# Patient Record
Sex: Female | Born: 1940
Health system: Southern US, Community
[De-identification: ages and names within clinical notes are randomized; demographics above are authoritative.]

## PROBLEM LIST (undated history)

## (undated) DIAGNOSIS — F329 Major depressive disorder, single episode, unspecified: Secondary | ICD-10-CM

## (undated) DIAGNOSIS — M1990 Unspecified osteoarthritis, unspecified site: Secondary | ICD-10-CM

## (undated) DIAGNOSIS — R269 Unspecified abnormalities of gait and mobility: Secondary | ICD-10-CM

## (undated) DIAGNOSIS — T7840XA Allergy, unspecified, initial encounter: Secondary | ICD-10-CM

## (undated) DIAGNOSIS — F0390 Unspecified dementia without behavioral disturbance: Secondary | ICD-10-CM

## (undated) DIAGNOSIS — I10 Essential (primary) hypertension: Secondary | ICD-10-CM

## (undated) DIAGNOSIS — F32A Depression, unspecified: Secondary | ICD-10-CM

## (undated) DIAGNOSIS — N189 Chronic kidney disease, unspecified: Secondary | ICD-10-CM

## (undated) DIAGNOSIS — E785 Hyperlipidemia, unspecified: Secondary | ICD-10-CM

## (undated) HISTORY — PX: TONSILLECTOMY: SUR1361

## (undated) HISTORY — DX: Unspecified abnormalities of gait and mobility: R26.9

## (undated) HISTORY — DX: Unspecified dementia, unspecified severity, without behavioral disturbance, psychotic disturbance, mood disturbance, and anxiety: F03.90

## (undated) HISTORY — DX: Chronic kidney disease, unspecified: N18.9

## (undated) HISTORY — DX: Essential (primary) hypertension: I10

## (undated) HISTORY — DX: Major depressive disorder, single episode, unspecified: F32.9

## (undated) HISTORY — DX: Unspecified osteoarthritis, unspecified site: M19.90

## (undated) HISTORY — DX: Hyperlipidemia, unspecified: E78.5

## (undated) HISTORY — DX: Depression, unspecified: F32.A

## (undated) HISTORY — DX: Allergy, unspecified, initial encounter: T78.40XA

---

## 1986-01-07 HISTORY — PX: COLOSTOMY: SHX63

## 1986-01-07 HISTORY — PX: OTHER SURGICAL HISTORY: SHX169

## 1997-01-07 HISTORY — PX: COLOSTOMY REVERSAL: SHX5782

## 2012-07-07 DIAGNOSIS — Q613 Polycystic kidney, unspecified: Secondary | ICD-10-CM | POA: Insufficient documentation

## 2012-07-07 DIAGNOSIS — Z905 Acquired absence of kidney: Secondary | ICD-10-CM | POA: Insufficient documentation

## 2013-06-27 DIAGNOSIS — N3020 Other chronic cystitis without hematuria: Secondary | ICD-10-CM | POA: Insufficient documentation

## 2016-07-11 LAB — QUANTIFERON TB GOLD ASSAY (BLOOD): QUANTIFERON TB GOLD: NEGATIVE

## 2017-03-20 ENCOUNTER — Encounter: Payer: Self-pay | Admitting: Nurse Practitioner

## 2017-03-20 ENCOUNTER — Ambulatory Visit: Payer: Medicare Other | Admitting: Nurse Practitioner

## 2017-03-20 VITALS — BP 136/70 | HR 102 | Temp 98.3°F | Ht 62.0 in | Wt 153.0 lb

## 2017-03-20 DIAGNOSIS — H9193 Unspecified hearing loss, bilateral: Secondary | ICD-10-CM | POA: Insufficient documentation

## 2017-03-20 DIAGNOSIS — H6123 Impacted cerumen, bilateral: Secondary | ICD-10-CM | POA: Diagnosis not present

## 2017-03-20 DIAGNOSIS — Z0001 Encounter for general adult medical examination with abnormal findings: Secondary | ICD-10-CM

## 2017-03-20 DIAGNOSIS — I10 Essential (primary) hypertension: Secondary | ICD-10-CM

## 2017-03-20 DIAGNOSIS — Q613 Polycystic kidney, unspecified: Secondary | ICD-10-CM | POA: Insufficient documentation

## 2017-03-20 DIAGNOSIS — R413 Other amnesia: Secondary | ICD-10-CM | POA: Insufficient documentation

## 2017-03-20 DIAGNOSIS — Z780 Asymptomatic menopausal state: Secondary | ICD-10-CM | POA: Diagnosis not present

## 2017-03-20 DIAGNOSIS — E782 Mixed hyperlipidemia: Secondary | ICD-10-CM

## 2017-03-20 DIAGNOSIS — B372 Candidiasis of skin and nail: Secondary | ICD-10-CM

## 2017-03-20 DIAGNOSIS — Z905 Acquired absence of kidney: Secondary | ICD-10-CM | POA: Insufficient documentation

## 2017-03-20 DIAGNOSIS — R739 Hyperglycemia, unspecified: Secondary | ICD-10-CM | POA: Diagnosis not present

## 2017-03-20 DIAGNOSIS — F325 Major depressive disorder, single episode, in full remission: Secondary | ICD-10-CM | POA: Insufficient documentation

## 2017-03-20 DIAGNOSIS — Z9049 Acquired absence of other specified parts of digestive tract: Secondary | ICD-10-CM | POA: Insufficient documentation

## 2017-03-20 LAB — BASIC METABOLIC PANEL
BUN: 15 mg/dL (ref 6–23)
CHLORIDE: 101 meq/L (ref 96–112)
CO2: 28 meq/L (ref 19–32)
CREATININE: 0.89 mg/dL (ref 0.40–1.20)
Calcium: 9.5 mg/dL (ref 8.4–10.5)
GFR: 65.26 mL/min (ref >60.00)
GLUCOSE: 108 mg/dL — AB (ref 70–99)
Potassium: 4.4 mEq/L (ref 3.5–5.1)
Sodium: 138 mEq/L (ref 135–145)

## 2017-03-20 LAB — HEMOGLOBIN A1C: HEMOGLOBIN A1C: 5.1 % (ref 4.6–6.5)

## 2017-03-20 LAB — HEPATIC FUNCTION PANEL
ALBUMIN: 4.2 g/dL (ref 3.5–5.2)
ALT: 13 U/L (ref 0–35)
AST: 13 U/L (ref 0–37)
Alkaline Phosphatase: 69 U/L (ref 39–117)
Bilirubin, Direct: 0.1 mg/dL (ref 0.0–0.3)
TOTAL PROTEIN: 7 g/dL (ref 6.0–8.3)
Total Bilirubin: 0.5 mg/dL (ref 0.2–1.2)

## 2017-03-20 LAB — LIPID PANEL
CHOLESTEROL: 145 mg/dL (ref 0–200)
HDL: 75.5 mg/dL (ref >39.00)
LDL Cholesterol: 52 mg/dL (ref 0–99)
NonHDL: 69.91
TRIGLYCERIDES: 90 mg/dL (ref 0.0–149.0)
Total CHOL/HDL Ratio: 2
VLDL: 18 mg/dL (ref 0.0–40.0)

## 2017-03-20 LAB — TSH: TSH: 2.29 u[IU]/mL (ref 0.35–4.50)

## 2017-03-20 MED ORDER — CLOTRIMAZOLE-BETAMETHASONE 1-0.05 % EX CREA: 1.0000 "application " | TOPICAL_CREAM | Freq: Two times a day (BID) | CUTANEOUS | 1 refills | Status: AC

## 2017-03-20 MED ORDER — CARBAMIDE PEROXIDE 6.5 % OT SOLN: 5.0000 [drp] | Freq: Two times a day (BID) | OTIC | 0 refills | Status: DC

## 2017-03-20 MED ORDER — MICONAZOLE NITRATE 2 % EX POWD: Freq: Two times a day (BID) | CUTANEOUS | 1 refills | Status: AC | PRN

## 2017-03-20 NOTE — Progress Notes (Signed)
Subjective:  Patient ID: Tricia Daniel, female    DOB: [birth year]  Age: 77 y.o. MRN: [id number]  CC: Establish Care (est care/check up blood work,fasting)  HPI Accompanied by daughter today Tricia Daniel, [place]).  She lives with daughter, son in law and granddaughter.  Has 24hrs supervision at home.  Previous pcp: Dr. Katherine Daniel. Last labs doen 12/2016.  [place] [facility] every 62months: Dr. Franchot Daniel every Sunday.  Methotrexate every Thurday  Dr. Sharlee Daniel: Podiatry every 60months due to long and thick nails. No numbness or tingling No foot ulcers No edema.  Polycystic kidney disease: Nephrologist: Dr. Mena Daniel. Has OV once a year S/p left nephrectomy 1988 due to severe infection NSAIDs used seldomly for severe pain. Reports nephrology is aware per patient.  Depression: Stable with prozac x 64yrs. Short term memory deficit per daughter: Visual and auditory hallucination x 1year. Psychosis (suspicious behavior, accusing family of taking things) x 24months. No wandering No labile mood. Sleeps 9-11hrs a day.  Hard of Hearing (bilateral): need referral to audiology.  High Fall risk: 76falls in last 73month. Grab bars in bath room and shower. Raised commode. No loose rugs. Lift chair at home. Hospital bed at home. Use of walker.(daughter reports she is more dependent on her walker recently due to fear of falling)  Has advanced directive with assigned HCPOA Tricia Daniel).  Rash under right axilla  Stool incontinence due to loose bowels( chronic per daughter, onset after partial colectomy 1988 and reversed colostomy 1989)  Not interested in mammogram at this time. Never had one.  Outpatient Medications Prior to Visit  Medication Sig Dispense Refill  . aspirin EC 81 MG tablet Take 81 mg by mouth daily.    . diclofenac sodium (VOLTAREN) 1 % GEL Apply topically.    Marland Kitchen etanercept (ENBREL SURECLICK) 50 MG/ML injection Inject into the skin.    Marland Kitchen FLUoxetine  (PROZAC) 10 MG capsule TAKE ONE CAPSULE BY MOUTH EVERY night, patient needs office visit  1  . FLUoxetine (PROZAC) 40 MG capsule Take by mouth daily.  1  . folic acid (FOLVITE) 1 MG tablet Take 1 mg by mouth daily.  6  . furosemide (LASIX) 40 MG tablet Take 40 mg by mouth.    Marland Kitchen ibuprofen (ADVIL,MOTRIN) 200 MG tablet Take by mouth.    . meloxicam (MOBIC) 7.5 MG tablet Take 7.5 mg by mouth 2 (two) times daily as needed.  3  . methotrexate (RHEUMATREX) 2.5 MG tablet Take by mouth.    . potassium chloride SA (K-DUR,KLOR-CON) 20 MEQ tablet Take by mouth.    . predniSONE (DELTASONE) 5 MG tablet TAKE ONE-HALF TO ONE tablet daily FOR 30 DAYS  3  . amLODipine (NORVASC) 10 MG tablet Take by mouth.    . simvastatin (ZOCOR) 20 MG tablet TAKE ONE TABLET BY MOUTH nighly  1   No facility-administered medications prior to visit.     ROS See HPI  Objective:  BP 136/70   Pulse (!) 102   Temp 98.3 F (36.8 C)   Ht 5\' 2"  (1.575 m)   Wt 153 lb (69.4 kg)   SpO2 96%   BMI 27.98 kg/m   BP Readings from Last 3 Encounters:  03/20/17 136/70    Wt Readings from Last 3 Encounters:  03/20/17 153 lb (69.4 kg)    Physical Exam  Constitutional: She is oriented to person, place, and time. No distress.  Neck: Normal range of motion. Neck supple.  Cardiovascular: Normal rate, regular rhythm and  normal heart sounds.  No murmur heard. Pulmonary/Chest: Effort normal and breath sounds normal.  Abdominal: Soft. Bowel sounds are normal. She exhibits no distension. There is no tenderness.  Musculoskeletal: She exhibits no edema.  Neurological: She is alert and oriented to person, place, and time.  Ambulates with walker.  Skin: Skin is warm and dry.  Psychiatric: She has a normal mood and affect. Her behavior is normal.  Vitals reviewed.   Lab Results  Component Value Date   GLUCOSE 108 (H) 03/20/2017   CHOL 145 03/20/2017   TRIG 90.0 03/20/2017   HDL 75.50 03/20/2017   LDLCALC 52 03/20/2017   ALT 13  03/20/2017   AST 13 03/20/2017   NA 138 03/20/2017   K 4.4 03/20/2017   CL 101 03/20/2017   CREATININE 0.89 03/20/2017   BUN 15 03/20/2017   CO2 28 03/20/2017   TSH 2.29 03/20/2017   HGBA1C 5.1 03/20/2017   Assessment & Plan:   [name] was seen today for establish care.  Diagnoses and all orders for this visit:  Encounter for preventative adult health care exam with abnormal findings -     Lipid panel -     TSH  Asymptomatic postmenopausal estrogen deficiency -     Vitamin D 1,25 dihydroxy -     DG Bone Density  Hyperglycemia -     Hemoglobin A1c  Mixed hyperlipidemia -     Lipid panel -     simvastatin (ZOCOR) 20 MG tablet; TAKE ONE TABLET BY MOUTH nighly  Essential hypertension -     Basic metabolic panel -     Hepatic function panel -     amLODipine (NORVASC) 10 MG tablet; Take 1 tablet (10 mg total) by mouth daily.  Polycystic kidney disease -     Basic metabolic panel  Candidal intertrigo -     miconazole (MICOTIN) 2 % powder; Apply topically 2 (two) times daily as needed for itching. -     clotrimazole-betamethasone (LOTRISONE) cream; Apply 1 application topically 2 (two) times daily.  Bilateral hearing loss, unspecified hearing loss type -     Ambulatory referral to Audiology  Bilateral impacted cerumen -     carbamide peroxide (DEBROX) 6.5 % OTIC solution; Place 5 drops into both ears 2 (two) times daily.  Depression, major, single episode, complete remission (HCC)  Memory deficit -     Ambulatory referral to Neurology   I have changed Tricia Daniel's amLODipine. I am also having her start on miconazole, clotrimazole-betamethasone, and carbamide peroxide. Additionally, I am having her maintain her potassium chloride SA, predniSONE, folic acid, meloxicam, FLUoxetine, FLUoxetine, methotrexate, diclofenac sodium, ibuprofen, etanercept, aspirin EC, furosemide, and simvastatin.  Meds ordered this encounter  Medications  . miconazole (MICOTIN) 2 % powder      Sig: Apply topically 2 (two) times daily as needed for itching.    Dispense:  70 g    Refill:  1    Order Specific Question:   Supervising Provider    Answer:   Dianne Dun [[id number]]  . clotrimazole-betamethasone (LOTRISONE) cream    Sig: Apply 1 application topically 2 (two) times daily.    Dispense:  45 g    Refill:  1    Order Specific Question:   Supervising Provider    Answer:   Dianne Dun [[id number]]  . carbamide peroxide (DEBROX) 6.5 % OTIC solution    Sig: Place 5 drops into both ears 2 (two) times daily.    Dispense:  15 mL    Refill:  0    Order Specific Question:   Supervising Provider    Answer:   Dianne Dun [[id number]]  . amLODipine (NORVASC) 10 MG tablet    Sig: Take 1 tablet (10 mg total) by mouth daily.    Dispense:  90 tablet    Refill:  3    Order Specific Question:   Supervising Provider    Answer:   Dianne Dun [[id number]]  . simvastatin (ZOCOR) 20 MG tablet    Sig: TAKE ONE TABLET BY MOUTH nighly    Dispense:  90 tablet    Refill:  3    Order Specific Question:   Supervising Provider    Answer:   Dianne Dun [[id number]]   Follow-up: Return in about 4 days (around 03/24/2017) for cerumen removal and re eval memory deficit (needs MMSE completed).  Alysia Penna, NP

## 2017-03-20 NOTE — Patient Instructions (Addendum)
You will be contacted to schedule appt with neurology, audiology and for dexa scan.  Go to lab for blood draw. Will send medication refill after review of lab results.  Please bring copy of Advance directive with assigned HCPOA.  Use debrox solution 3days prior to next appt, to facilitate ear lavage.

## 2017-03-21 ENCOUNTER — Encounter: Payer: Self-pay | Admitting: Nurse Practitioner

## 2017-03-21 DIAGNOSIS — E782 Mixed hyperlipidemia: Secondary | ICD-10-CM | POA: Insufficient documentation

## 2017-03-21 MED ORDER — SIMVASTATIN 20 MG PO TABS: ORAL_TABLET | ORAL | 3 refills | Status: DC

## 2017-03-21 MED ORDER — AMLODIPINE BESYLATE 10 MG PO TABS: 10.0000 mg | ORAL_TABLET | Freq: Every day | ORAL | 3 refills | Status: DC

## 2017-03-23 LAB — VITAMIN D 1,25 DIHYDROXY
Vitamin D 1, 25 (OH)2 Total: 21 pg/mL (ref 18–72)
Vitamin D2 1, 25 (OH)2: <8 pg/mL
Vitamin D3 1, 25 (OH)2: 21 pg/mL

## 2017-03-24 ENCOUNTER — Encounter: Payer: Self-pay | Admitting: Nurse Practitioner

## 2017-03-24 ENCOUNTER — Ambulatory Visit: Payer: Medicare Other | Admitting: Nurse Practitioner

## 2017-03-24 VITALS — BP 132/80 | HR 93 | Temp 98.0°F | Ht 62.0 in | Wt 154.0 lb

## 2017-03-24 DIAGNOSIS — H6123 Impacted cerumen, bilateral: Secondary | ICD-10-CM

## 2017-03-24 NOTE — Patient Instructions (Signed)
Advised to continue use of debrox solution.

## 2017-03-24 NOTE — Progress Notes (Signed)
Subjective:  Patient ID: Tricia Daniel, female    DOB: January 31, 1940  Age: 77 y.o. MRN: [id number]  CC: Follow-up (F/U with cerumen. No pain. Memory F/U)   HPI  Tricia Daniel is here for cerumen removal. She is accompanied by her daughter. She has used debrox solution for last 3days. Denies any ear pain or drainage or tinnitus.  Outpatient Medications Prior to Visit  Medication Sig Dispense Refill  . amLODipine (NORVASC) 10 MG tablet Take 1 tablet (10 mg total) by mouth daily. 90 tablet 3  . aspirin EC 81 MG tablet Take 81 mg by mouth daily.    . calcium acetate (PHOSLO) 667 MG capsule Take by mouth 3 (three) times daily with meals.    . Calcium Carbonate (CALCIUM 600 PO) Take 600 Units by mouth 2 (two) times daily.    . carbamide peroxide (DEBROX) 6.5 % OTIC solution Place 5 drops into both ears 2 (two) times daily. 15 mL 0  . Cholecalciferol (VITAMIN D PO) Take 800 Units by mouth daily.    . clotrimazole-betamethasone (LOTRISONE) cream Apply 1 application topically 2 (two) times daily. 45 g 1  . diclofenac sodium (VOLTAREN) 1 % GEL Apply topically.    Marland Kitchen etanercept (ENBREL SURECLICK) 50 MG/ML injection Inject into the skin.    Marland Kitchen FLUoxetine (PROZAC) 10 MG capsule TAKE ONE CAPSULE BY MOUTH EVERY night, patient needs office visit  1  . FLUoxetine (PROZAC) 40 MG capsule Take by mouth daily.  1  . folic acid (FOLVITE) 1 MG tablet Take 1 mg by mouth daily.  6  . furosemide (LASIX) 40 MG tablet Take 40 mg by mouth.    Marland Kitchen ibuprofen (ADVIL,MOTRIN) 200 MG tablet Take by mouth.    . meloxicam (MOBIC) 7.5 MG tablet Take 7.5 mg by mouth 2 (two) times daily as needed.  3  . methotrexate (RHEUMATREX) 2.5 MG tablet Take by mouth.    . miconazole (MICOTIN) 2 % powder Apply topically 2 (two) times daily as needed for itching. 70 g 1  . potassium chloride SA (K-DUR,KLOR-CON) 20 MEQ tablet Take by mouth.    . predniSONE (DELTASONE) 5 MG tablet TAKE ONE-HALF TO ONE tablet daily FOR 30 DAYS  3  . simvastatin  (ZOCOR) 20 MG tablet TAKE ONE TABLET BY MOUTH nighly 90 tablet 3   No facility-administered medications prior to visit.     ROS See HPI  Objective:  BP 132/80   Pulse 93   Temp 98 F (36.7 C) (Oral)   Ht 5\' 2"  (1.575 m)   Wt 154 lb (69.9 kg)   SpO2 95%   BMI 28.17 kg/m   BP Readings from Last 3 Encounters:  03/24/17 132/80  03/20/17 136/70    Wt Readings from Last 3 Encounters:  03/24/17 154 lb (69.9 kg)  03/20/17 153 lb (69.4 kg)    Physical Exam  HENT:  Right Ear: External ear and ear canal normal.  Left Ear: Tympanic membrane, external ear and ear canal normal.  Unable to completely clean right ear canal.  Cardiovascular: Normal rate.  Pulmonary/Chest: Effort normal.  Vitals reviewed.   Lab Results  Component Value Date   GLUCOSE 108 (H) 03/20/2017   CHOL 145 03/20/2017   TRIG 90.0 03/20/2017   HDL 75.50 03/20/2017   LDLCALC 52 03/20/2017   ALT 13 03/20/2017   AST 13 03/20/2017   NA 138 03/20/2017   K 4.4 03/20/2017   CL 101 03/20/2017   CREATININE 0.89 03/20/2017  BUN 15 03/20/2017   CO2 28 03/20/2017   TSH 2.29 03/20/2017   HGBA1C 5.1 03/20/2017    Procedure Note :     Procedure :  Ear irrigation (bilateral)   Indication:  Cerumen impaction (bilateral)   Risks, including pain, dizziness, eardrum perforation, bleeding, infection and others as well as benefits were explained to the patient in detail. Verbal consent was obtained and the patient agreed to proceed.    We used "The Elephant Ear Irrigation Device" filled with lukewarm water for irrigation. A large amount wax was recovered. Procedure has also required manual wax removal with an ear loop.   Tolerated well. Complications:none   Postprocedure instructions :  Call if problems.   Assessment & Plan:   There are no diagnoses linked to this encounter. I am having Tricia Daniel maintain her potassium chloride SA, predniSONE, folic acid, meloxicam, FLUoxetine, FLUoxetine, methotrexate,  diclofenac sodium, ibuprofen, etanercept, aspirin EC, furosemide, miconazole, clotrimazole-betamethasone, carbamide peroxide, amLODipine, simvastatin, Cholecalciferol (VITAMIN D PO), calcium acetate, and Calcium Carbonate (CALCIUM 600 PO).  No orders of the defined types were placed in this encounter.   Follow-up: No Follow-up on file.  Alysia Penna, NP

## 2017-04-04 ENCOUNTER — Other Ambulatory Visit: Payer: Self-pay

## 2017-04-04 ENCOUNTER — Encounter: Payer: Self-pay | Admitting: Neurology

## 2017-04-04 ENCOUNTER — Ambulatory Visit: Payer: Medicare Other | Admitting: Neurology

## 2017-04-04 VITALS — BP 144/76 | HR 92 | Ht 62.0 in | Wt 152.0 lb

## 2017-04-04 DIAGNOSIS — R269 Unspecified abnormalities of gait and mobility: Secondary | ICD-10-CM | POA: Diagnosis not present

## 2017-04-04 DIAGNOSIS — R413 Other amnesia: Secondary | ICD-10-CM

## 2017-04-04 DIAGNOSIS — E538 Deficiency of other specified B group vitamins: Secondary | ICD-10-CM | POA: Diagnosis not present

## 2017-04-04 HISTORY — DX: Unspecified abnormalities of gait and mobility: R26.9

## 2017-04-04 NOTE — Progress Notes (Signed)
Reason for visit: Memory disturbance, gait disturbance  Referring physician: Dr. Duayne Cal is a 77 y.o. female  History of present illness:  Tricia Daniel is a 77 year old right-handed white female with a history of a progressive memory disturbance that was mild over the last 5 years but has significantly worsened over the last 1 year.  The patient has had some issues with visual and auditory hallucinations, there was some concern about depression initially but this has improved.  The patient has difficulty with word finding, she remembers names of people fairly well.  The patient does not operate a motor vehicle, she never has.  She now requires assistance over the last year with keeping of medications and appointments.  Her daughter had to take over the finances about 4 years ago.  The patient has recently begun having some problems with walking that has become most significant over the last 1 month.  The patient now requires a walker to get around.  She needs assistance with standing up.  She has developed increased fatigue over the last 6 months.  She denies any numbness or weakness of the extremities, she denies neck pain or low back pain or pain down the arms or legs.  She does have some incontinence of the bowels and the bladder, she wears adult diapers.  She has a history of psoriatic arthritis and is on Enbrel for this.  The patient does not have a family history of memory problems.  She is sent to this office for further evaluation.  Past Medical History:  Diagnosis Date  . Allergy   . Arthritis   . Chronic kidney disease   . Dementia   . Depression   . Gait abnormality 04/04/2017  . Hyperlipidemia   . Hypertension     Past Surgical History:  Procedure Laterality Date  . COLOSTOMY  1988  . COLOSTOMY REVERSAL  1999  . nephrectomy Left 1988  . TONSILLECTOMY      Family History  Problem Relation Age of Onset  . Arthritis Mother   . Hearing loss Mother   . Heart  attack Father   . Heart disease Father   . Heart Problems Father   . Arthritis Brother   . Arthritis Daughter   . Asthma Daughter   . Depression Daughter   . Heart disease Daughter   . Hypertension Daughter   . Diabetes Maternal Grandmother   . Heart attack Maternal Grandmother   . Arthritis Daughter   . Depression Daughter   . Hyperlipidemia Daughter     Social history:  reports that she has never smoked. She has never used smokeless tobacco. She reports that she does not drink alcohol or use drugs.  Medications:  Prior to Admission medications   Medication Sig Start Date End Date Taking? Authorizing Provider  amLODipine (NORVASC) 10 MG tablet Take 1 tablet (10 mg total) by mouth daily. 03/21/17  Yes Nche, Bonna Gains, NP  aspirin EC 81 MG tablet Take 81 mg by mouth daily.   Yes [provider]  Calcium Acetate, Phos Binder, (CALCIUM ACETATE PO) Take 600 mg by mouth 2 (two) times daily.   Yes [provider]  carbamide peroxide (DEBROX) 6.5 % OTIC solution Place 5 drops into both ears 2 (two) times daily. 03/20/17  Yes Nche, Bonna Gains, NP  Cholecalciferol (VITAMIN D PO) Take 800 Units by mouth daily.   Yes [provider]  clotrimazole-betamethasone (LOTRISONE) cream Apply 1 application topically 2 (two)  times daily. 03/20/17  Yes Nche, Bonna Gains, NP  diclofenac sodium (VOLTAREN) 1 % GEL Apply topically. 01/06/15  Yes [provider]  etanercept (ENBREL SURECLICK) 50 MG/ML injection Inject into the skin. 08/29/16  Yes [provider]  FLUoxetine (PROZAC) 10 MG capsule TAKE ONE CAPSULE BY MOUTH EVERY night, patient needs office visit 12/25/16  Yes [provider]  FLUoxetine (PROZAC) 40 MG capsule Take by mouth daily. 03/07/17  Yes [provider]  folic acid (FOLVITE) 1 MG tablet Take 1 mg by mouth daily. 03/08/17  Yes [provider]  furosemide (LASIX) 40 MG tablet Take 40 mg by mouth.   Yes [provider]  ibuprofen (ADVIL,MOTRIN) 200 MG tablet Take by mouth.   Yes [provider]  meloxicam (MOBIC) 7.5 MG tablet Take 7.5 mg by mouth 2 (two) times daily as needed. 02/06/17  Yes [provider]  methotrexate (RHEUMATREX) 2.5 MG tablet Take 12.5 mg by mouth once a week. 5 tablets every Thursday   Yes [provider]  miconazole (MICOTIN) 2 % powder Apply topically 2 (two) times daily as needed for itching. 03/20/17  Yes Nche, Bonna Gains, NP  potassium chloride SA (K-DUR,KLOR-CON) 20 MEQ tablet Take by mouth. 09/06/16  Yes [provider]  predniSONE (DELTASONE) 5 MG tablet TAKE ONE-HALF TO ONE tablet daily FOR 30 DAYS 03/07/17  Yes [provider]  simvastatin (ZOCOR) 20 MG tablet TAKE ONE TABLET BY MOUTH nighly 03/21/17  Yes Nche, Bonna Gains, NP      Allergies  Allergen Reactions  . Latex Rash    If leave it on too long    ROS:  Out of a complete 14 system review of symptoms, the patient complains only of the following symptoms, and all other reviewed systems are negative.  Hearing loss Itching Incontinence of the bowels and bladder Easy bruising Feeling cold, increased thirst Joint pain, joint swelling, muscle cramps, aching muscles Memory loss, confusion Depression, anxiety, hallucinations  Blood pressure (!) 144/76, pulse 92, height 5\' 2"  (1.575 m), weight 152 lb (68.9 kg).  Physical Exam  General: The patient is alert and cooperative at the time of the examination.  Eyes: Pupils are equal, round, and reactive to light. Discs are flat bilaterally.  Neck: The neck is supple, no carotid bruits are noted.  Respiratory: The respiratory examination is clear.  Cardiovascular: The cardiovascular examination reveals a regular rate and rhythm, no obvious murmurs or rubs are noted.  Skin: Extremities are without significant edema.  Neurologic Exam  Mental status: The patient is alert and oriented x 3 at the time of the  examination. The Mini-Mental status examination done today shows a total score 25/30.  Cranial nerves: Facial symmetry is present. There is good sensation of the face to pinprick and soft touch bilaterally. The strength of the facial muscles and the muscles to head turning and shoulder shrug are normal bilaterally. Speech is well enunciated, no aphasia or dysarthria is noted. Extraocular movements are full. Visual fields are full. The tongue is midline, and the patient has symmetric elevation of the soft palate. No obvious hearing deficits are noted.  Motor: The motor testing reveals 5 over 5 strength of all 4 extremities, with exception of 4/5 strength with hip flexion bilaterally. Good symmetric motor tone is noted throughout.  Sensory: Sensory testing is intact to pinprick, soft touch and vibration sensation on all 4 extremities.  Position sense is decreased on all 4 extremities.  No evidence of extinction  is noted.  Coordination: Cerebellar testing reveals good finger-nose-finger and heel-to-shin bilaterally.  Mild apraxia with use of the extremities is seen.  Gait and station: Gait is extremely wide-based, unsteady, the patient requires assistance for standing.  Tandem gait was not attempted.  Romberg is unsteady but the patient does not fall.  Reflexes: Deep tendon reflexes are symmetric but are relatively brisk in the upper extremities, the ankle jerk reflexes are maintained bilaterally. Toes are downgoing bilaterally.   Assessment/Plan:  1.  Progressive memory disturbance  2.  Progressive gait disturbance  The patient has developed some troubles with memory that have accelerated over the last 1 year, within the last 1 month she has had onset of some significant issues with balance.  She has fallen on several occasions over the last month.  The examination shows some increased reflexes in the upper extremities.  The patient will be sent for blood work today, she will have MRI of the  brain.  Small vessel disease and normal pressure hydrocephalus do need to be considered, but the memory disturbance preceded the gait disturbance.  I suppose that could be 2 separate processes with cervical spine disease and a separate dementia process.  The patient will follow-up in 4 months.  Marlan Palau MD 04/04/2017 11:58 AM  [facility] [address] [place], [place] [address]  Phone [phone] Fax [phone]

## 2017-04-04 NOTE — Patient Instructions (Signed)
We will check MRI of the brain and get blood work today. 

## 2017-04-06 LAB — RPR: RPR: NONREACTIVE

## 2017-04-06 LAB — COPPER, SERUM: COPPER: 126 ug/dL (ref 72–166)

## 2017-04-06 LAB — SEDIMENTATION RATE: Sed Rate: 5 mm/hr (ref 0–40)

## 2017-04-06 LAB — VITAMIN B12: Vitamin B-12: 502 pg/mL (ref 232–1245)

## 2017-04-07 ENCOUNTER — Telehealth: Payer: Self-pay | Admitting: *Deleted

## 2017-04-07 ENCOUNTER — Telehealth: Payer: Self-pay | Admitting: Neurology

## 2017-04-07 NOTE — Telephone Encounter (Signed)
-----   Message from York Spaniel, MD sent at 04/06/2017  4:26 PM EDT -----  The blood work results are unremarkable. Please call the patient.  ----- Message ----- From: Nell Range Lab Results In Sent: 04/05/2017   7:40 AM To: York Spaniel, MD

## 2017-04-07 NOTE — Telephone Encounter (Signed)
Called and spoke w/ pt about unremarkable labs per CW,MD note. She verbalized understanding and appreciation for call.

## 2017-04-07 NOTE — Telephone Encounter (Signed)
[employer] Berkley Harvey: [id number] (exp. 04/07/17 to 05/06/17) order sent to GI they will contact pt to schedule.

## 2017-04-18 ENCOUNTER — Ambulatory Visit
Admission: RE | Admit: 2017-04-18 | Discharge: 2017-04-18 | Disposition: A | Discharge status: Home / Self Care | Payer: Medicare Other | Source: Ambulatory Visit | Attending: Neurology | Admitting: Neurology

## 2017-04-18 DIAGNOSIS — R413 Other amnesia: Secondary | ICD-10-CM | POA: Diagnosis not present

## 2017-04-18 DIAGNOSIS — R269 Unspecified abnormalities of gait and mobility: Secondary | ICD-10-CM

## 2017-04-20 ENCOUNTER — Telehealth: Payer: Self-pay | Admitting: Neurology

## 2017-04-20 NOTE — Telephone Encounter (Signed)
  I called the patient.  The MRI of the brain shows mild to moderate small vessel ischemic changes.  This probably would not explain her progressive balance changes, if she is amenable to it, we will go on and get a MRI of the cervical spine to rule out a cervical myelopathy.  The memory issues will need to be followed over time.  She will call us if she is okay with getting MRI of the cervical spine.  MRI brain 04/20/17:  IMPRESSION:  Unremarkable MRI Brain wo except for mild age related changes of chronic microvascular ischemia Study  is compromised by motion artefacts.

## 2017-05-02 ENCOUNTER — Encounter: Payer: Self-pay | Admitting: Nurse Practitioner

## 2017-05-07 ENCOUNTER — Telehealth: Payer: Self-pay | Admitting: Nurse Practitioner

## 2017-05-07 NOTE — Telephone Encounter (Signed)
Please advise, okey for refills? We never send these meds in before.      Copied from CRM [id number]. Topic: General - Other >> May 07, 2017  4:19 PM Elliot Gault wrote: Relation to pt: self Call back number: [phone] Pharmacy:   [facility]- [facility] - [place], [place] - [address] [phone] (Phone) [phone] (Fax)   Reason for call:  Daughter requesting potassium chloride SA (K-DUR,KLOR-CON) 20 MEQ tablet and FLUoxetine (PROZAC) 40 MG capsule, pharmacy contacted and request fax over twice last week, please advise  >> May 07, 2017  4:23 PM Elliot Gault wrote: Relation to pt: Rolley Sims / daughter  Call back number: [phone] Pharmacy:   [facility]- [facility] - [place], [place] - [address] [phone] (Phone) [phone] (Fax)   Reason for call:  Daughter requesting 90 day supply of potassium chloride SA (K-DUR,KLOR-CON) 20 MEQ tablet and FLUoxetine (PROZAC) 40 MG capsule, pharmacy contacted and request fax over twice last week, please advise  >> May 07, 2017  4:53 PM Alexander Bergeron B wrote: Pt's daughter called b/c she missed a phone call from the office that was not documented but contact pt's daughter if needed, contact info in previous note

## 2017-05-08 MED ORDER — FLUOXETINE HCL 10 MG PO CAPS: ORAL_CAPSULE | ORAL | 1 refills | Status: DC

## 2017-05-08 MED ORDER — POTASSIUM CHLORIDE CRYS ER 20 MEQ PO TBCR: 20.0000 meq | EXTENDED_RELEASE_TABLET | Freq: Every day | ORAL | 1 refills | Status: DC

## 2017-05-08 NOTE — Telephone Encounter (Signed)
Ok to refill, but please make sure she is still taking furosemide.  If not, do not send potassium

## 2017-05-08 NOTE — Telephone Encounter (Signed)
rx sent to pharmacy as pt's daughter requested.   Pt is taking Lasix.

## 2017-05-22 ENCOUNTER — Telehealth: Payer: Self-pay | Admitting: Nurse Practitioner

## 2017-05-22 DIAGNOSIS — I10 Essential (primary) hypertension: Secondary | ICD-10-CM

## 2017-05-22 NOTE — Telephone Encounter (Signed)
Copied from CRM [id number]. Topic: Quick Communication - Rx Refill/Question >> May 22, 2017  1:52 PM Alexander Bergeron B wrote: Pharmacy called to get verbals or a fax for the medication; since Nche is the new pcp the pharmacy will need this   Medication: furosemide (LASIX) 40 MG tablet [[id number]]  Has the patient contacted their pharmacy? Yes.   (Agent: If no, request that the patient contact the pharmacy for the refill.) Preferred Pharmacy (with phone number or street name): [facility] Agent: Please be advised that RX refills may take up to 3 business days. We ask that you follow-up with your pharmacy.

## 2017-05-22 NOTE — Telephone Encounter (Signed)
Please advise, we never send in this rx.

## 2017-05-22 NOTE — Telephone Encounter (Signed)
LOV  03/20/17 Tricia Daniel See pharmacy request.

## 2017-05-23 ENCOUNTER — Telehealth: Payer: Self-pay | Admitting: Nurse Practitioner

## 2017-05-23 MED ORDER — FUROSEMIDE 40 MG PO TABS: 40.0000 mg | ORAL_TABLET | Freq: Every day | ORAL | 0 refills | Status: DC

## 2017-05-23 NOTE — Telephone Encounter (Signed)
Left vm for the pt to call back, need to inform Nche message below.

## 2017-05-23 NOTE — Telephone Encounter (Signed)
Pt is aware. appt made for 08/2017

## 2017-05-23 NOTE — Telephone Encounter (Signed)
Based on last OV, my understanding was, this was prescribed by neprhology, so that is where her refill request needs to go. Thank you

## 2017-05-23 NOTE — Telephone Encounter (Signed)
Pt called in stating:   She called in questioning her Lasix Rx.   It used to be prescribed by Dr. Jacques Navy.  But the pt has transferred to the [facility] location.   The lasix was always ordered with her BP medication.   Her kidney doctor does not prescribe this.   She was given a 90 day refill from Dr. Katherine Mantle to get her though until she could get a refill.  Is it possible that Alysia Penna, NP can start prescribing this? Thanks.  I read the note to the pt from [facility] regarding the nephrologist needing to order it.   See above notes.   Please advise.

## 2017-05-23 NOTE — Telephone Encounter (Signed)
She called in questioning her Lasix Rx.   It used to be prescribed by Dr. Jacques Navy.  But the pt has transferred to the [facility] location.   The lasix was always ordered with her BP medication.   Her kidney doctor does not prescribe this.   She was given a 90 day refill from Dr. Katherine Mantle to get her though until she could get a refill.  Is it possible that Alysia Penna, NP can start prescribing this? Thanks.  I read the note to the pt from [facility] regarding the nephrologist needing to order it.   See above notes.

## 2017-08-15 ENCOUNTER — Other Ambulatory Visit: Payer: Self-pay | Admitting: Nurse Practitioner

## 2017-08-15 DIAGNOSIS — I10 Essential (primary) hypertension: Secondary | ICD-10-CM

## 2017-08-20 ENCOUNTER — Ambulatory Visit: Payer: Medicare Other | Admitting: Nurse Practitioner

## 2017-09-01 ENCOUNTER — Ambulatory Visit: Payer: Medicare Other | Admitting: Nurse Practitioner

## 2017-09-09 ENCOUNTER — Ambulatory Visit: Payer: Medicare Other | Admitting: Nurse Practitioner

## 2017-09-09 ENCOUNTER — Ambulatory Visit (INDEPENDENT_AMBULATORY_CARE_PROVIDER_SITE_OTHER): Payer: Medicare Other

## 2017-09-09 ENCOUNTER — Encounter: Payer: Self-pay | Admitting: Nurse Practitioner

## 2017-09-09 VITALS — BP 122/64 | HR 87 | Temp 98.2°F | Ht 62.0 in | Wt 134.0 lb

## 2017-09-09 DIAGNOSIS — R109 Unspecified abdominal pain: Secondary | ICD-10-CM

## 2017-09-09 DIAGNOSIS — R634 Abnormal weight loss: Secondary | ICD-10-CM | POA: Diagnosis not present

## 2017-09-09 DIAGNOSIS — I10 Essential (primary) hypertension: Secondary | ICD-10-CM | POA: Diagnosis not present

## 2017-09-09 DIAGNOSIS — F325 Major depressive disorder, single episode, in full remission: Secondary | ICD-10-CM

## 2017-09-09 DIAGNOSIS — R319 Hematuria, unspecified: Secondary | ICD-10-CM | POA: Diagnosis not present

## 2017-09-09 DIAGNOSIS — E782 Mixed hyperlipidemia: Secondary | ICD-10-CM

## 2017-09-09 LAB — POCT URINALYSIS DIPSTICK
BILIRUBIN UA: NEGATIVE
GLUCOSE UA: NEGATIVE
KETONES UA: NEGATIVE
Leukocytes, UA: NEGATIVE
Nitrite, UA: NEGATIVE
PH UA: 6 (ref 5.0–8.0)
Protein, UA: NEGATIVE
Spec Grav, UA: 1.015 (ref 1.010–1.025)
UROBILINOGEN UA: 0.2 U/dL

## 2017-09-09 LAB — BASIC METABOLIC PANEL
BUN: 23 mg/dL (ref 6–23)
CHLORIDE: 101 meq/L (ref 96–112)
CO2: 26 meq/L (ref 19–32)
Calcium: 9.9 mg/dL (ref 8.4–10.5)
Creatinine, Ser: 1.05 mg/dL (ref 0.40–1.20)
GFR: 53.86 mL/min — ABNORMAL LOW (ref >60.00)
Glucose, Bld: 107 mg/dL — ABNORMAL HIGH (ref 70–99)
Potassium: 4.8 mEq/L (ref 3.5–5.1)
SODIUM: 139 meq/L (ref 135–145)

## 2017-09-09 LAB — HEPATIC FUNCTION PANEL
ALBUMIN: 4.1 g/dL (ref 3.5–5.2)
ALT: 12 U/L (ref 0–35)
AST: 15 U/L (ref 0–37)
Alkaline Phosphatase: 53 U/L (ref 39–117)
BILIRUBIN DIRECT: 0.2 mg/dL (ref 0.0–0.3)
BILIRUBIN TOTAL: 0.8 mg/dL (ref 0.2–1.2)
TOTAL PROTEIN: 7.1 g/dL (ref 6.0–8.3)

## 2017-09-09 LAB — TSH: TSH: 1.87 u[IU]/mL (ref 0.35–4.50)

## 2017-09-09 MED ORDER — SIMVASTATIN 20 MG PO TABS: ORAL_TABLET | ORAL | 3 refills | Status: DC

## 2017-09-09 MED ORDER — FLUOXETINE HCL 10 MG PO CAPS: ORAL_CAPSULE | ORAL | 3 refills | Status: DC

## 2017-09-09 MED ORDER — AMLODIPINE BESYLATE 10 MG PO TABS: 10.0000 mg | ORAL_TABLET | Freq: Every day | ORAL | 3 refills | Status: DC

## 2017-09-09 NOTE — Progress Notes (Signed)
Subjective:  Patient ID: Tricia Daniel, female    DOB: 23-Apr-1940  Age: 77 y.o. MRN: [id number]  CC: Follow-up (med follow up/checking BMP/pneumonia shot?) and Back Pain (back pain,2-3 days. help when lay down,icy hot and gel. last time this complaint was dx UTI. )  Accompanied by daughter.  Back Pain  This is a new problem. The current episode started in the past 7 days. The problem occurs constantly. The problem has been waxing and waning since onset. The pain is present in the thoracic spine (thoracolumbar, right side). The quality of the pain is described as aching. The pain does not radiate. The symptoms are aggravated by lying down. Associated symptoms include bladder incontinence, bowel incontinence and weight loss. Pertinent negatives include no abdominal pain, chest pain, dysuria, fever, headaches, pelvic pain, perianal numbness or weakness. (Bowel and bladder incontinence (chronic)) Risk factors include poor posture, sedentary lifestyle, lack of exercise and history of osteoporosis. She has tried ice for the symptoms.   HTN: Stable with amlodipine. BP Readings from Last 3 Encounters:  09/09/17 122/64  04/04/17 (!) 144/76  03/24/17 132/80   Daughter reports 20Lbs weight loss since last OV, it is unintentional, reports decreased appetite and early satiety, no change in BM (incontinent, and loose since partial colectomy). Wt Readings from Last 3 Encounters:  09/09/17 134 lb (60.8 kg)  04/04/17 152 lb (68.9 kg)  03/24/17 154 lb (69.9 kg)   Mood: Stable with prozac 50mg .  Reviewed past Medical, Social and Family history today.  Outpatient Medications Prior to Visit  Medication Sig Dispense Refill  . Calcium Acetate, Phos Binder, (CALCIUM ACETATE PO) Take 600 mg by mouth 2 (two) times daily.    . carbamide peroxide (DEBROX) 6.5 % OTIC solution Place 5 drops into both ears 2 (two) times daily. 15 mL 0  . Cholecalciferol (VITAMIN D PO) Take 800 Units by mouth daily.    .  clotrimazole-betamethasone (LOTRISONE) cream Apply 1 application topically 2 (two) times daily. 45 g 1  . diclofenac sodium (VOLTAREN) 1 % GEL Apply topically.    Marland Kitchen etanercept (ENBREL SURECLICK) 50 MG/ML injection Inject into the skin.    . folic acid (FOLVITE) 1 MG tablet Take 1 mg by mouth daily.  6  . methotrexate (RHEUMATREX) 2.5 MG tablet Take 12.5 mg by mouth once a week. 5 tablets every Thursday    . miconazole (MICOTIN) 2 % powder Apply topically 2 (two) times daily as needed for itching. 70 g 1  . predniSONE (DELTASONE) 5 MG tablet TAKE ONE-HALF TO ONE tablet daily FOR 30 DAYS  3  . amLODipine (NORVASC) 10 MG tablet Take 1 tablet (10 mg total) by mouth daily. 90 tablet 3  . FLUoxetine (PROZAC) 10 MG capsule TAKE ONE CAPSULE BY MOUTH EVERY night 90 capsule 1  . furosemide (LASIX) 40 MG tablet Take 1 tablet (40 mg total) by mouth daily. Needs office visit for additional refills 90 tablet 1  . ibuprofen (ADVIL,MOTRIN) 200 MG tablet Take by mouth.    . meloxicam (MOBIC) 7.5 MG tablet Take 7.5 mg by mouth 2 (two) times daily as needed.  3  . potassium chloride SA (K-DUR,KLOR-CON) 20 MEQ tablet Take 1 tablet (20 mEq total) by mouth daily. 90 tablet 1  . simvastatin (ZOCOR) 20 MG tablet TAKE ONE TABLET BY MOUTH nighly 90 tablet 3  . aspirin EC 81 MG tablet Take 81 mg by mouth daily.     No facility-administered medications prior to visit.  ROS Review of Systems  Constitutional: Positive for weight loss. Negative for chills, fever and malaise/fatigue.  HENT: Negative for congestion, sinus pain and sore throat.   Respiratory: Negative for cough and shortness of breath.   Cardiovascular: Negative for chest pain, palpitations and leg swelling.  Gastrointestinal: Positive for bowel incontinence. Negative for abdominal pain, blood in stool, melena, nausea and vomiting.  Genitourinary: Positive for bladder incontinence and flank pain. Negative for dysuria, frequency, pelvic pain and urgency.    Musculoskeletal: Positive for back pain. Negative for falls, joint pain and myalgias.  Neurological: Negative for dizziness, sensory change, weakness and headaches.  Endo/Heme/Allergies: Negative for polydipsia.  Psychiatric/Behavioral: The patient does not have insomnia.      Objective:  BP 122/64   Pulse 87   Temp 98.2 F (36.8 C) (Oral)   Ht 5\' 2"  (1.575 m)   Wt 134 lb (60.8 kg)   SpO2 96%   BMI 24.51 kg/m   BP Readings from Last 3 Encounters:  09/09/17 122/64  04/04/17 (!) 144/76  03/24/17 132/80    Wt Readings from Last 3 Encounters:  09/09/17 134 lb (60.8 kg)  04/04/17 152 lb (68.9 kg)  03/24/17 154 lb (69.9 kg)   Physical Exam  Constitutional: She is oriented to person, place, and time.  Cardiovascular: Normal rate and regular rhythm.  Pulmonary/Chest: Effort normal and breath sounds normal.  Abdominal: Soft. Bowel sounds are normal. She exhibits no distension. There is no tenderness.  Musculoskeletal: She exhibits no edema.  Neurological: She is alert and oriented to person, place, and time.  Skin: Skin is warm and dry. No rash noted.  Psychiatric: She has a normal mood and affect. Her behavior is normal.  Vitals reviewed.  Lab Results  Component Value Date   GLUCOSE 107 (H) 09/09/2017   CHOL 145 03/20/2017   TRIG 90.0 03/20/2017   HDL 75.50 03/20/2017   LDLCALC 52 03/20/2017   ALT 12 09/09/2017   AST 15 09/09/2017   NA 139 09/09/2017   K 4.8 09/09/2017   CL 101 09/09/2017   CREATININE 1.05 09/09/2017   BUN 23 09/09/2017   CO2 26 09/09/2017   TSH 1.87 09/09/2017   HGBA1C 5.1 03/20/2017    Mr Brain Wo Contrast  Result Date: 04/19/2017  [facility] [address], [address] [place], [place] [address] [phone] NEUROIMAGING REPORT STUDY DATE:04/18/2017 PATIENT NAME: Tricia Daniel DOB: Feb 15, 1940 MRN: [id number] ORDERING CLINICIAN: Dr Anne Hahn CLINICAL HISTORY:  51 year patient with memory loss COMPARISON FILMS: none EXAM: MRI Brain wo  TECHNIQUE: MRI of the brain without contrast was obtained utilizing 5 mm axial slices with T1, T2, T2 flair, T2 star gradient echo and diffusion weighted views.  T1 sagittal and T2 coronal views were obtained.Study is compromised by motion artefacts CONTRAST: none IMAGING SITE: [facility] FINDINGS: The study is compromised by motion artefacts.The brain parenchyma shows mild changes of chronic microvascular ischemia.No abnormal lesions are seen on diffusion-weighted views to suggest acute ischemia. The cortical sulci, fissures and cisterns are normal in size and appearance. Lateral, third and fourth ventricle are normal in size and appearance. No extra-axial fluid collections are seen. No evidence of mass effect or midline shift.  On sagittal views the posterior fossa, pituitary gland and corpus callosum are unremarkable. No evidence of intracranial hemorrhage on gradient-echo views. The orbits and their contents, paranasal sinuses and calvarium are unremarkable.  Intracranial flow voids are present.There are mild chanfges of chronicparanasal sinusitis. Upper cervical spine shows mild disc  degenerative changes but no compression    Unremarkable MRI Brain wo except for mild age related changes of chronic microvascular ischemia Study  is compromised by motion artefacts. INTERPRETING PHYSICIAN: Delia Heady, MD Certified in  Neuroimaging by American Society of Neuroimaging and [employer]    Assessment & Plan:   Brienne was seen today for follow-up and back pain.  Diagnoses and all orders for this visit:  Acute right flank pain -     POCT urinalysis dipstick -     Urine Culture -     DG Chest 2 View  Hematuria, unspecified type -     POCT urinalysis dipstick -     Urine Culture  Weight loss, unintentional -     TSH -     DG Chest 2 View -     Hepatic function panel  Essential hypertension -     Basic Metabolic Panel (BMET) -     amLODipine (NORVASC)  10 MG tablet; Take 1 tablet (10 mg total) by mouth daily. -     potassium chloride SA (K-DUR,KLOR-CON) 20 MEQ tablet; Take 1 tablet (20 mEq total) by mouth daily. -     furosemide (LASIX) 20 MG tablet; Take 1 tablet (20 mg total) by mouth daily. Needs office visit for additional refills  Mixed hyperlipidemia -     Discontinue: simvastatin (ZOCOR) 20 MG tablet; TAKE ONE TABLET BY MOUTH nighly -     simvastatin (ZOCOR) 20 MG tablet; TAKE ONE TABLET BY MOUTH nighly  Depression, major, single episode, complete remission (HCC) -     Discontinue: FLUoxetine (PROZAC) 10 MG capsule; TAKE ONE CAPSULE BY MOUTH EVERY night -     FLUoxetine (PROZAC) 10 MG capsule; TAKE ONE CAPSULE BY MOUTH daily. Takes total of 50mg  once a day -     FLUoxetine (PROZAC) 40 MG capsule; Take 1 capsule (40 mg total) by mouth daily. Takes total of 50mg  daily   I have discontinued Theta Heiman's meloxicam, ibuprofen, and FLUoxetine. I have also changed her FLUoxetine and furosemide. Additionally, I am having her start on FLUoxetine. Lastly, I am having her maintain her predniSONE, folic acid, methotrexate, diclofenac sodium, etanercept, aspirin EC, miconazole, clotrimazole-betamethasone, carbamide peroxide, Cholecalciferol (VITAMIN D PO), (Calcium Acetate, Phos Binder, (CALCIUM ACETATE PO)), amLODipine, simvastatin, and potassium chloride SA.  Meds ordered this encounter  Medications  . amLODipine (NORVASC) 10 MG tablet    Sig: Take 1 tablet (10 mg total) by mouth daily.    Dispense:  90 tablet    Refill:  3    Order Specific Question:   Supervising Provider    Answer:   [[id number]]  . DISCONTD: FLUoxetine (PROZAC) 10 MG capsule    Sig: TAKE ONE CAPSULE BY MOUTH EVERY night    Dispense:  90 capsule    Refill:  3    Order Specific Question:   Supervising Provider    Answer:   [[id number]]  . DISCONTD: simvastatin (ZOCOR) 20 MG tablet    Sig: TAKE ONE TABLET BY MOUTH nighly    Dispense:  90 tablet     Refill:  3    Order Specific Question:   Supervising Provider    Answer:   Dianne Dun [[id number]]  . FLUoxetine (PROZAC) 10 MG capsule    Sig: TAKE ONE CAPSULE BY MOUTH daily. Takes total of 50mg  once a day    Dispense:  90 capsule    Refill:  3    Order Specific Question:   Supervising Provider    Answer:   Dianne Dun [[id number]]  . FLUoxetine (PROZAC) 40 MG capsule    Sig: Take 1 capsule (40 mg total) by mouth daily. Takes total of 50mg  daily    Dispense:  90 capsule    Refill:  3    Order Specific Question:   Supervising Provider    Answer:   [[id number]]  . simvastatin (ZOCOR) 20 MG tablet    Sig: TAKE ONE TABLET BY MOUTH nighly    Dispense:  90 tablet    Refill:  3    Order Specific Question:   Supervising Provider    Answer:   Dianne Dun [[id number]]  . potassium chloride SA (K-DUR,KLOR-CON) 20 MEQ tablet    Sig: Take 1 tablet (20 mEq total) by mouth daily.    Dispense:  90 tablet    Refill:  1    Order Specific Question:   Supervising Provider    Answer:   Dianne Dun [[id number]]  . furosemide (LASIX) 20 MG tablet    Sig: Take 1 tablet (20 mg total) by mouth daily. Needs office visit for additional refills    Dispense:  90 tablet    Refill:  3    Order Specific Question:   Supervising Provider    Answer:   Dianne Dun [[id number]]   Follow-up: Return in about 3 months (around 12/09/2017) for unintentional weight loss (14/03/2017).  , NP

## 2017-09-09 NOTE — Patient Instructions (Addendum)
No acute finding on CXR. Stable lab results.  Will send furosemide and potassium refill after review of lab results.  Continue boost drink twice a day.  Consider ABD/pelvis CT if labs normal and continuous decline in weight.  High-Protein and High-Calorie Diet Eating high-protein and high-calorie foods can help you to gain weight, heal after an injury, and recover after an illness or surgery. What is my plan? The specific amount of daily protein and calories you need depends on:  Your body weight.  The reason this diet is recommended for you.  Generally, a high-protein, high-calorie diet involves:  Eating 250-500 extra calories each day.  Making sure that 10-35% of your daily calories come from protein.  Talk to your health care provider about how much protein and how many calories you need each day. Follow the diet as directed by your health care provider. What do I need to know about this diet?  Ask your health care provider if you should take a nutritional supplement.  Try to eat six small meals each day instead of three large meals.  Eat a balanced diet, including one food that is high in protein at each meal.  Keep nutritious snacks handy, such as nuts, trail mixes, dried fruit, and yogurt.  If you have kidney disease or diabetes, eating too much protein may put extra stress on your kidneys. Talk to your health care provider if you have either of those conditions. What are some high-protein foods? Grains Quinoa. Bulgur wheat. Vegetables Soybeans. Peas. Meats and Other Protein Sources Beef, pork, and poultry. Fish and seafood. Eggs. Tofu. Textured vegetable protein (TVP). Peanut butter. Nuts and seeds. Dried beans. Protein powders. Dairy Whole milk. Whole-milk yogurt. Powdered milk. Cheese. [employer]. Eggnog. Beverages High-protein supplement drinks. Soy milk. Other Protein bars. The items listed above may not be a complete list of recommended foods or  beverages. Contact your dietitian for more options. What are some high-calorie foods? Grains Pasta. Quick breads. Muffins. Pancakes. Ready-to-eat cereal. Vegetables Vegetables cooked in oil or butter. Fried potatoes. Fruits Dried fruit. Fruit leather. Canned fruit in syrup. Fruit juice. Avocados. Meats and Other Protein Sources Peanut butter. Nuts and seeds. Dairy Heavy cream. Whipped cream. Cream cheese. Sour cream. Ice cream. Custard. Pudding. Beverages Meal-replacement beverages. Nutrition shakes. Fruit juice. Sugar-sweetened soft drinks. Condiments Salad dressing. Mayonnaise. Alfredo sauce. Fruit preserves or jelly. Honey. Syrup. Sweets/Desserts Cake. Cookies. Pie. Pastries. Candy bars. Chocolate. Fats and Oils Butter or margarine. Oil. Gravy. Other Meal-replacement bars. The items listed above may not be a complete list of recommended foods or beverages. Contact your dietitian for more options. What are some tips for including high-protein and high-calorie foods in my diet?  Add whole milk, half-and-half, or heavy cream to cereal, pudding, soup, or hot cocoa.  Add whole milk to instant breakfast drinks.  Add peanut butter to oatmeal or smoothies.  Add powdered milk to baked goods, smoothies, or milkshakes.  Add powdered milk, cream, or butter to mashed potatoes.  Add cheese to cooked vegetables.  Make whole-milk yogurt parfaits. Top them with granola, fruit, or nuts.  Add cottage cheese to your fruit.  Add avocados, cheese, or both to sandwiches or salads.  Add meat, poultry, or seafood to rice, pasta, casseroles, salads, and soups.  Use mayonnaise when making egg salad, chicken salad, or tuna salad.  Use peanut butter as a topping for pretzels, celery, or crackers.  Add beans to casseroles, dips, and spreads.  Add pureed beans to sauces and soups.  Replace calorie-free drinks with calorie-containing drinks, such as milk and fruit juice. This information is  not intended to replace advice given to you by your health care provider. Make sure you discuss any questions you have with your health care provider. Document Released: 12/24/2004 Document Revised: 06/01/2015 Document Reviewed: 06/08/2013 Elsevier Interactive Patient Education  [employer].

## 2017-09-10 ENCOUNTER — Encounter: Payer: Self-pay | Admitting: Nurse Practitioner

## 2017-09-10 LAB — URINE CULTURE
MICRO NUMBER:: 91049067
Result:: NO GROWTH
SPECIMEN QUALITY:: ADEQUATE

## 2017-09-10 MED ORDER — FLUOXETINE HCL 40 MG PO CAPS: 40.0000 mg | ORAL_CAPSULE | Freq: Every day | ORAL | 3 refills | Status: AC

## 2017-09-10 MED ORDER — POTASSIUM CHLORIDE CRYS ER 20 MEQ PO TBCR: 20.0000 meq | EXTENDED_RELEASE_TABLET | Freq: Every day | ORAL | 1 refills | Status: DC

## 2017-09-10 MED ORDER — SIMVASTATIN 20 MG PO TABS: ORAL_TABLET | ORAL | 3 refills | Status: DC

## 2017-09-10 MED ORDER — FLUOXETINE HCL 10 MG PO CAPS: ORAL_CAPSULE | ORAL | 3 refills | Status: AC

## 2017-09-10 MED ORDER — FUROSEMIDE 20 MG PO TABS: 20.0000 mg | ORAL_TABLET | Freq: Every day | ORAL | 3 refills | Status: DC

## 2017-09-24 ENCOUNTER — Ambulatory Visit: Payer: Medicare Other | Admitting: Nurse Practitioner

## 2017-10-14 ENCOUNTER — Ambulatory Visit: Payer: Medicare Other | Admitting: Neurology

## 2017-10-31 LAB — HEPATIC FUNCTION PANEL
ALT: 15 (ref 7–35)
AST: 29 (ref 13–35)
Alkaline Phosphatase: 74 (ref 25–125)
Bilirubin, Total: 0.7

## 2017-10-31 LAB — BASIC METABOLIC PANEL
BUN: 15 (ref 4–21)
Creatinine: 0.9 (ref 0.5–1.1)
Glucose: 106
Potassium: 4.8 (ref 3.4–5.3)
Sodium: 141 (ref 137–147)

## 2017-10-31 LAB — CBC AND DIFFERENTIAL
HCT: 40 (ref 36–46)
Hemoglobin: 12.9 (ref 12.0–16.0)
NEUTROS ABS: 15
Platelets: 346 (ref 150–399)
WBC: 17.8

## 2017-11-11 ENCOUNTER — Ambulatory Visit: Payer: Medicare Other | Admitting: Neurology

## 2017-11-18 LAB — CBC AND DIFFERENTIAL
HCT: 39 (ref 36–46)
Hemoglobin: 12.7 (ref 12.0–16.0)
Neutrophils Absolute: 12
Platelets: 264 (ref 150–399)
WBC: 13

## 2017-11-25 ENCOUNTER — Ambulatory Visit: Payer: Medicare Other | Admitting: Adult Health

## 2017-11-25 ENCOUNTER — Encounter: Payer: Self-pay | Admitting: Adult Health

## 2017-11-25 ENCOUNTER — Telehealth: Payer: Self-pay | Admitting: *Deleted

## 2017-11-25 NOTE — Telephone Encounter (Signed)
Daughter called this morning and cancelled, rescheduled patient's FU today, re: patient is sick.

## 2017-12-09 ENCOUNTER — Encounter: Payer: Self-pay | Admitting: Nurse Practitioner

## 2017-12-09 ENCOUNTER — Ambulatory Visit: Payer: Medicare Other | Admitting: Nurse Practitioner

## 2017-12-09 VITALS — BP 140/74 | HR 80 | Temp 98.1°F | Ht 62.0 in | Wt 135.0 lb

## 2017-12-09 DIAGNOSIS — D72829 Elevated white blood cell count, unspecified: Secondary | ICD-10-CM | POA: Diagnosis not present

## 2017-12-09 DIAGNOSIS — Z23 Encounter for immunization: Secondary | ICD-10-CM | POA: Diagnosis not present

## 2017-12-09 DIAGNOSIS — R413 Other amnesia: Secondary | ICD-10-CM

## 2017-12-09 DIAGNOSIS — F325 Major depressive disorder, single episode, in full remission: Secondary | ICD-10-CM | POA: Diagnosis not present

## 2017-12-09 DIAGNOSIS — I10 Essential (primary) hypertension: Secondary | ICD-10-CM | POA: Diagnosis not present

## 2017-12-09 DIAGNOSIS — R443 Hallucinations, unspecified: Secondary | ICD-10-CM

## 2017-12-09 DIAGNOSIS — J310 Chronic rhinitis: Secondary | ICD-10-CM

## 2017-12-09 MED ORDER — IPRATROPIUM BROMIDE 0.03 % NA SOLN: 1.0000 | Freq: Two times a day (BID) | NASAL | 0 refills | Status: AC

## 2017-12-09 NOTE — Progress Notes (Signed)
Subjective:  Patient ID: Tricia Daniel, female    DOB: [birth year]  Age: 77 y.o. MRN: [id number]  CC: Follow-up (follow up unintentional weight loss/daughter report that the pt is talking and not sleep. small red spot on right bott area due to incontinent--using calmoseptin cream. flu shot.  )  HPI  Accompanied by daughter:   Memory Deficit and Depression: Last OV with neurology 03/2017, next appt tomorrow Daughter reports hallucination (visual and auditory) x 34months continuous, worse at bedtime or when in her bedroom, previous episodes have not lasted this long per daughter. This interferes with sleep.  current use of prozac 50mg   Generalized joint pain secondary to psoriatic arthritis: Managed by [facility] (Dr. Maple Watrous) with enbrel and methotrexate. Last OV 11/18/17. Noted persistent elevated WBC since 07/2017 (reviewed labs and sent for scan: 03/2017 10.8, 07/2017 16.7, 10/2017 17.8, 11/2017 13.0) Denies any acute complains. She was evaluated by me 09/2017: BMP, urinalysis with culture and CXR were normal. Tramadol prescribed but does not help with joint pain. No improvement with tylenol, some improvement with advil 1-2times a week.  HTN: Controlled with amlodipine and furosemide. BMP last done 10/31/2017: normal. BP Readings from Last 3 Encounters:  12/10/17 134/75  12/09/17 140/74  09/09/17 122/64   Reviewed past Medical, Social and Family history today.  Outpatient Medications Prior to Visit  Medication Sig Dispense Refill  . amLODipine (NORVASC) 10 MG tablet Take 1 tablet (10 mg total) by mouth daily. 90 tablet 3  . Calcium Acetate, Phos Binder, (CALCIUM ACETATE PO) Take 600 mg by mouth 2 (two) times daily.    . carbamide peroxide (DEBROX) 6.5 % OTIC solution Place 5 drops into both ears 2 (two) times daily. 15 mL 0  . Cholecalciferol (VITAMIN D PO) Take 800 Units by mouth daily.    . clotrimazole-betamethasone (LOTRISONE) cream Apply 1 application topically 2 (two)  times daily. 45 g 1  . diclofenac sodium (VOLTAREN) 1 % GEL Apply topically.    Marland Kitchen etanercept (ENBREL SURECLICK) 50 MG/ML injection Inject into the skin.    Marland Kitchen FLUoxetine (PROZAC) 10 MG capsule TAKE ONE CAPSULE BY MOUTH daily. Takes total of 50mg  once a day 90 capsule 3  . FLUoxetine (PROZAC) 40 MG capsule Take 1 capsule (40 mg total) by mouth daily. Takes total of 50mg  daily 90 capsule 3  . folic acid (FOLVITE) 1 MG tablet Take 1 mg by mouth daily.  6  . furosemide (LASIX) 20 MG tablet Take 1 tablet (20 mg total) by mouth daily. Needs office visit for additional refills 90 tablet 3  . methotrexate (RHEUMATREX) 2.5 MG tablet Take 12.5 mg by mouth once a week. 5 tablets every Thursday    . miconazole (MICOTIN) 2 % powder Apply topically 2 (two) times daily as needed for itching. 70 g 1  . potassium chloride SA (K-DUR,KLOR-CON) 20 MEQ tablet Take 1 tablet (20 mEq total) by mouth daily. 90 tablet 1  . predniSONE (DELTASONE) 5 MG tablet TAKE ONE-HALF TO ONE tablet daily FOR 30 DAYS  3  . simvastatin (ZOCOR) 20 MG tablet TAKE ONE TABLET BY MOUTH nighly 90 tablet 3  . aspirin EC 81 MG tablet Take 81 mg by mouth daily.     No facility-administered medications prior to visit.     ROS Review of Systems  Constitutional: Negative.   HENT: Negative.   Eyes: Negative.   Respiratory: Negative.   Cardiovascular: Negative.   Gastrointestinal: Negative.   Genitourinary: Negative.   Musculoskeletal: Positive for  joint pain. Negative for falls.  Skin: Negative.   Psychiatric/Behavioral: Positive for depression, hallucinations and memory loss. Negative for suicidal ideas. The patient has insomnia. The patient is not nervous/anxious.     Objective:  BP 140/74   Pulse 80   Temp 98.1 F (36.7 C) (Oral)   Ht 5\' 2"  (1.575 m)   Wt 135 lb (61.2 kg)   SpO2 91%   BMI 24.69 kg/m   BP Readings from Last 3 Encounters:  12/10/17 134/75  12/09/17 140/74  09/09/17 122/64    Wt Readings from Last 3  Encounters:  12/10/17 134 lb (60.8 kg)  12/09/17 135 lb (61.2 kg)  09/09/17 134 lb (60.8 kg)    Physical Exam  Constitutional: No distress.  Neck: Normal range of motion. Neck supple. No thyromegaly present.  Cardiovascular: Normal rate and regular rhythm.  Pulmonary/Chest: Effort normal and breath sounds normal.  Abdominal: Soft. Bowel sounds are normal. There is no tenderness.  Musculoskeletal: She exhibits no edema.  Wheelchair dependent, able to stand with 1 person assist.  Lymphadenopathy:    She has no cervical adenopathy.  Neurological: She is alert.  Oriented to family, place and situation. Tends to repeat herself often  Skin: She is not diaphoretic.     Psychiatric: She has a normal mood and affect. Her behavior is normal. She is actively hallucinating. She exhibits abnormal recent memory. She is attentive.  Vitals reviewed.   Lab Results  Component Value Date   GLUCOSE 107 (H) 09/09/2017   CHOL 145 03/20/2017   TRIG 90.0 03/20/2017   HDL 75.50 03/20/2017   LDLCALC 52 03/20/2017   ALT 12 09/09/2017   AST 15 09/09/2017   NA 139 09/09/2017   K 4.8 09/09/2017   CL 101 09/09/2017   CREATININE 1.05 09/09/2017   BUN 23 09/09/2017   CO2 26 09/09/2017   TSH 1.87 09/09/2017   HGBA1C 5.1 03/20/2017    Mr Brain Wo Contrast  Result Date: 04/19/2017  [facility] [address] [place], [place] [phone] NEUROIMAGING REPORT STUDY DATE:04/18/2017 PATIENT NAME: Tricia Daniel DOB: [birth year] MRN: [id number] ORDERING CLINICIAN: Dr [id number] CLINICAL HISTORY:  75 year patient with memory loss COMPARISON FILMS: none EXAM: MRI Brain wo TECHNIQUE: MRI of the brain without contrast was obtained utilizing 5 mm axial slices with T1, T2, T2 flair, T2 star gradient echo and diffusion weighted views.  T1 sagittal and T2 coronal views were obtained.Study is compromised by motion artefacts CONTRAST: none IMAGING SITE: [facility] FINDINGS: The study is  compromised by motion artefacts.The brain parenchyma shows mild changes of chronic microvascular ischemia.No abnormal lesions are seen on diffusion-weighted views to suggest acute ischemia. The cortical sulci, fissures and cisterns are normal in size and appearance. Lateral, third and fourth ventricle are normal in size and appearance. No extra-axial fluid collections are seen. No evidence of mass effect or midline shift.  On sagittal views the posterior fossa, pituitary gland and corpus callosum are unremarkable. No evidence of intracranial hemorrhage on gradient-echo views. The orbits and their contents, paranasal sinuses and calvarium are unremarkable.  Intracranial flow voids are present.There are mild chanfges of chronicparanasal sinusitis. Upper cervical spine shows mild disc degenerative changes but no compression    Unremarkable MRI Brain wo except for mild age related changes of chronic microvascular ischemia Study  is compromised by motion artefacts. INTERPRETING PHYSICIAN: 61, MD Certified in  Neuroimaging by American Society of Neuroimaging and [employer] Neurological Subspecialities  Assessment & Plan:  No indication of acute illness at this time, so we will wait for repeat CBC and CMP from rheumatology. Consider referral to hematology?  Maintain appt with neurology tomorrow for further evaluation of hallucination.  Continue use of duoderm and frequent repositioning to relief pressure.  Amariz was seen today for follow-up.  Diagnoses and all orders for this visit:  Essential hypertension -     Cancel: Basic metabolic panel  Leukocytosis, unspecified type -     Cancel: Urinalysis w microscopic + reflex cultur -     Cancel: CBC  Depression, major, single episode, complete remission (HCC)  Memory deficit  Hallucinations  Need for influenza vaccination -     Flu vaccine HIGH DOSE PF  Chronic rhinitis -     ipratropium (ATROVENT) 0.03 % nasal spray; Place  1 spray into both nostrils 2 (two) times daily. Do not use for more than 5days.   I am having [facility] start on ipratropium. I am also having her maintain her predniSONE, folic acid, methotrexate, diclofenac sodium, etanercept, miconazole, clotrimazole-betamethasone, carbamide peroxide, Cholecalciferol (VITAMIN D PO), (Calcium Acetate, Phos Binder, (CALCIUM ACETATE PO)), amLODipine, FLUoxetine, FLUoxetine, simvastatin, potassium chloride SA, and furosemide.  Meds ordered this encounter  Medications  . ipratropium (ATROVENT) 0.03 % nasal spray    Sig: Place 1 spray into both nostrils 2 (two) times daily. Do not use for more than 5days.    Dispense:  30 mL    Refill:  0    Order Specific Question:   Supervising Provider    Answer:   MATTHEWS, CODY [[id number]]    Problem List Items Addressed This Visit      Cardiovascular and Mediastinum   Essential hypertension - Primary     Other   Depression, major, single episode, complete remission (HCC)   Hallucinations   Memory deficit    Other Visit Diagnoses    Leukocytosis, unspecified type       Need for influenza vaccination       Relevant Orders   Flu vaccine HIGH DOSE PF (Completed)   Chronic rhinitis       Relevant Medications   ipratropium (ATROVENT) 0.03 % nasal spray       Follow-up: Return in about 4 weeks (around 01/06/2018) for HTN and dementia.  Alysia Penna, NP

## 2017-12-09 NOTE — Patient Instructions (Addendum)
No indication of acute illness at this time, so we will wait for repeat CBC and CMP from rheumatology.  Maintain appt with neurology tomorrow to discuss continuous hallucinations  Continue use of duoderm and frequent repositioning to relief pressure.

## 2017-12-10 ENCOUNTER — Ambulatory Visit: Payer: Medicare Other | Admitting: Neurology

## 2017-12-10 ENCOUNTER — Telehealth: Payer: Self-pay | Admitting: *Deleted

## 2017-12-10 ENCOUNTER — Other Ambulatory Visit: Payer: Self-pay

## 2017-12-10 ENCOUNTER — Telehealth: Payer: Self-pay | Admitting: Neurology

## 2017-12-10 ENCOUNTER — Encounter: Payer: Self-pay | Admitting: Neurology

## 2017-12-10 VITALS — BP 134/75 | HR 74 | Resp 20 | Ht 62.0 in | Wt 134.0 lb

## 2017-12-10 DIAGNOSIS — R413 Other amnesia: Secondary | ICD-10-CM | POA: Diagnosis not present

## 2017-12-10 DIAGNOSIS — G3281 Cerebellar ataxia in diseases classified elsewhere: Secondary | ICD-10-CM

## 2017-12-10 DIAGNOSIS — R269 Unspecified abnormalities of gait and mobility: Secondary | ICD-10-CM

## 2017-12-10 MED ORDER — RISPERIDONE 0.5 MG PO TABS: 0.5000 mg | ORAL_TABLET | Freq: Every day | ORAL | 3 refills | Status: DC

## 2017-12-10 NOTE — Telephone Encounter (Signed)
PA for Risperidone 0.5mg  tablets #30/30 completed via Cover My Meds. Key# [id number]. Dx: Dementia, Sleep Disturbance, Hallucinations./fim

## 2017-12-10 NOTE — Patient Instructions (Signed)
We will start Risperidol at night for the hallucinations and agitation.

## 2017-12-10 NOTE — Progress Notes (Signed)
Reason for visit: Memory disorder, gait disorder  Tricia Daniel is an 77 y.o. female  History of present illness:  Tricia Daniel is a 77 year old right-handed white female with a history of a memory disturbance dating back about 6 years, most significantly over the last year to 18 months.  The patient is not on any medication for memory currently.  MRI of the brain was done and showed a moderate level small vessel ischemic changes, the gait disturbance has become more significant over the last 6 to 8 months.  The patient is using a rolling walker with a seat, she will fall on occasion, she does have a tendency to go backwards.  The patient is having increasing problems with agitation and hallucinations both auditory and visual that is worse in the evening but may occur during the day as well.  The patient will get upset with the hallucinations.  The patient returns to this office for an evaluation.  At times, she is not sleeping well at night because of the hallucinations.  The patient has also developed urinary incontinence and currently wears adult diapers.  Past Medical History:  Diagnosis Date  . Allergy   . Arthritis   . Chronic kidney disease   . Dementia (HCC)   . Depression   . Gait abnormality 04/04/2017  . Hyperlipidemia   . Hypertension     Past Surgical History:  Procedure Laterality Date  . COLOSTOMY  1988  . COLOSTOMY REVERSAL  1999  . nephrectomy Left 1988  . TONSILLECTOMY      Family History  Problem Relation Age of Onset  . Arthritis Mother   . Hearing loss Mother   . Heart attack Father   . Heart disease Father   . Heart Problems Father   . Arthritis Brother   . Arthritis Daughter   . Asthma Daughter   . Depression Daughter   . Heart disease Daughter   . Hypertension Daughter   . Diabetes Maternal Grandmother   . Heart attack Maternal Grandmother   . Arthritis Daughter   . Depression Daughter   . Hyperlipidemia Daughter     Social history:  reports  that she has never smoked. She has never used smokeless tobacco. She reports that she does not drink alcohol or use drugs.    Allergies  Allergen Reactions  . Latex Rash    If leave it on too long    Medications:  Prior to Admission medications   Medication Sig Start Date End Date Taking? Authorizing Provider  amLODipine (NORVASC) 10 MG tablet Take 1 tablet (10 mg total) by mouth daily. 09/09/17  Yes Nche, Bonna Gains, NP  Calcium Acetate, Phos Binder, (CALCIUM ACETATE PO) Take 600 mg by mouth 2 (two) times daily.   Yes [provider]  carbamide peroxide (DEBROX) 6.5 % OTIC solution Place 5 drops into both ears 2 (two) times daily. 03/20/17  Yes Nche, Bonna Gains, NP  Cholecalciferol (VITAMIN D PO) Take 800 Units by mouth daily.   Yes [provider]  clotrimazole-betamethasone (LOTRISONE) cream Apply 1 application topically 2 (two) times daily. 03/20/17  Yes Nche, Bonna Gains, NP  diclofenac sodium (VOLTAREN) 1 % GEL Apply topically. 01/06/15  Yes [provider]  etanercept (ENBREL SURECLICK) 50 MG/ML injection Inject into the skin. 08/29/16  Yes [provider]  FLUoxetine (PROZAC) 10 MG capsule TAKE ONE CAPSULE BY MOUTH daily. Takes total of 50mg  once a day 09/10/17  Yes Nche, 11/10/17, NP  FLUoxetine (PROZAC) 40 MG capsule Take 1 capsule (40 mg total) by mouth daily. Takes total of 50mg  daily 09/10/17  Yes Nche, 11/10/17, NP  folic acid (FOLVITE) 1 MG tablet Take 1 mg by mouth daily. 03/08/17  Yes [provider]  furosemide (LASIX) 20 MG tablet Take 1 tablet (20 mg total) by mouth daily. Needs office visit for additional refills 09/10/17  Yes Nche, 11/10/17, NP  ipratropium (ATROVENT) 0.03 % nasal spray Place 1 spray into both nostrils 2 (two) times daily. Do not use for more than 5days. 12/09/17  Yes Nche, 14/3/19, NP  methotrexate (RHEUMATREX) 2.5 MG tablet Take 12.5 mg by mouth once a week. 5 tablets every Thursday   Yes  [provider]  miconazole (MICOTIN) 2 % powder Apply topically 2 (two) times daily as needed for itching. 03/20/17  Yes Nche, 03/22/17, NP  potassium chloride SA (K-DUR,KLOR-CON) 20 MEQ tablet Take 1 tablet (20 mEq total) by mouth daily. 09/10/17  Yes Nche, 11/10/17, NP  predniSONE (DELTASONE) 5 MG tablet TAKE ONE-HALF TO ONE tablet daily FOR 30 DAYS 03/07/17  Yes [provider]  simvastatin (ZOCOR) 20 MG tablet TAKE ONE TABLET BY MOUTH nighly 09/10/17  Yes Nche, 11/10/17, NP    ROS:  Out of a complete 14 system review of symptoms, the patient complains only of the following symptoms, and all other reviewed systems are negative.  Frequent waking, daytime sleepiness, sleep talking, acting out dreams Incontinence of the bladder Joint pain, joint swelling, back pain, aching muscles Memory loss Agitation, depression, anxiety, hallucinations  Blood pressure 134/75, pulse 74, resp. rate 20, height 5\' 2"  (1.575 m), weight 134 lb (60.8 kg).  Physical Exam  General: The patient is alert and cooperative at the time of the examination.  Skin: No significant peripheral edema is noted.   Neurologic Exam  Mental status: The patient is alert and oriented x 3 at the time of the examination. The Mini-Mental status examination done today shows a total score of 20/30.  The patient is able to name 5 four-legged animals in 1 minute.   Cranial nerves: Facial symmetry is present. Speech is normal, no aphasia or dysarthria is noted. Extraocular movements are full. Visual fields are full.  Motor: The patient has good strength in all 4 extremities.  Sensory examination: Soft touch sensation is symmetric on the face, arms, and legs.  Coordination: The patient has good finger-nose-finger and heel-to-shin bilaterally, but the patient does have apraxia with use of the extremities.  Gait and station: The patient has a somewhat wide-based gait, the gait is unsteady, she requires  assistance with walking.  Tandem gait was not attempted.  Romberg is negative.  Reflexes: Deep tendon reflexes are symmetric.   MRI brain 04/20/17:  IMPRESSION: Unremarkable MRI Brain w/o except for mild age related changes of chronic microvascular ischemia Study is compromised by motion artefacts.   * MRI scan images were reviewed online. I agree with the written report.    Assessment/Plan:  1.  Progressive memory disturbance, dementia  2.  Gait disturbance  3.  Hallucinations, agitation  The patient will have Risperdal added for the hallucinations in the evening taking 0.5 mg.  If she seems to do well with this, we may add Aricept in the near future, the daughter will call me.  The patient lives with the daughter.  The patient will be set up for physical therapy for gait training, we will check MRI of the cervical  spine to look for another etiology for her gait and bladder control problems.  She will follow-up in 6 months.  Marlan Palau MD 12/10/2017 12:13 PM  [facility] [address] [place], [place] [address]  Phone [phone] Fax [phone]

## 2017-12-10 NOTE — Telephone Encounter (Signed)
[employer] Medicare Auth: [id number] (exp. 12/10/17 to 01/08/18) order sent to [facility]. They will reach out to the pt to schedule.

## 2017-12-11 NOTE — Telephone Encounter (Signed)
Per Cover My Meds, Respirdone approved for dates 12/10/17-12/11/18. [employer] [place] Medicare Part D/fim

## 2017-12-15 ENCOUNTER — Encounter: Payer: Self-pay | Admitting: Nurse Practitioner

## 2017-12-19 ENCOUNTER — Other Ambulatory Visit: Payer: Self-pay | Admitting: Neurology

## 2017-12-19 MED ORDER — RISPERIDONE 0.5 MG PO TABS: ORAL_TABLET | ORAL | 1 refills | Status: DC

## 2018-01-02 ENCOUNTER — Encounter: Payer: Self-pay | Admitting: Nurse Practitioner

## 2018-01-02 DIAGNOSIS — E782 Mixed hyperlipidemia: Secondary | ICD-10-CM

## 2018-01-05 MED ORDER — SIMVASTATIN 20 MG PO TABS: ORAL_TABLET | ORAL | 2 refills | Status: AC

## 2018-01-05 NOTE — Telephone Encounter (Signed)
I called and spoke with the patients daughter (ok per DPR). I made her aware that the order was sent to [facility] and gave her the number to call them. DW

## 2018-01-08 NOTE — Telephone Encounter (Signed)
[facility] has tried to reach out to schedule on 12/11/17 & 01/05/18.

## 2018-01-09 ENCOUNTER — Ambulatory Visit: Payer: Medicare Other | Admitting: Rehabilitative and Restorative Service Providers"

## 2018-01-12 ENCOUNTER — Ambulatory Visit: Payer: Medicare Other | Admitting: Nurse Practitioner

## 2018-01-15 ENCOUNTER — Ambulatory Visit: Payer: Medicare Other | Admitting: Nurse Practitioner

## 2018-01-15 ENCOUNTER — Encounter: Payer: Self-pay | Admitting: Nurse Practitioner

## 2018-01-15 VITALS — BP 110/70 | HR 104 | Temp 98.8°F | Ht 62.0 in | Wt 124.8 lb

## 2018-01-15 DIAGNOSIS — I10 Essential (primary) hypertension: Secondary | ICD-10-CM | POA: Diagnosis not present

## 2018-01-15 DIAGNOSIS — R634 Abnormal weight loss: Secondary | ICD-10-CM | POA: Diagnosis not present

## 2018-01-15 DIAGNOSIS — K5900 Constipation, unspecified: Secondary | ICD-10-CM | POA: Diagnosis not present

## 2018-01-15 DIAGNOSIS — R8290 Unspecified abnormal findings in urine: Secondary | ICD-10-CM | POA: Diagnosis not present

## 2018-01-15 MED ORDER — AMLODIPINE BESYLATE 10 MG PO TABS: 5.0000 mg | ORAL_TABLET | Freq: Every day | ORAL | 3 refills | Status: AC

## 2018-01-15 MED ORDER — LACTULOSE 10 GM/15ML PO SOLN: 10.0000 g | Freq: Two times a day (BID) | ORAL | 0 refills | Status: AC | PRN

## 2018-01-15 NOTE — Progress Notes (Signed)
Subjective:  Patient ID: Tricia Daniel, female    DOB: [birth year]  Age: 78 y.o. MRN: [id number]  CC: Follow-up (f/u HTN and dementia/ this week little better, not arguing with people but still seeing cats/ BP doing good/ neuro therapist 1/9)  HPI accompanied by daughter  Dementia with hallucination: Improved mood with increase in risperdal dose. Aricept whas not been initiated. Upcoming appt with neurology 07/07/2018 Reports generalized weakness and difficulty walking. Has appt with neuro PT tomorrow.  HTN: Stable with amlodipine. BP Readings from Last 3 Encounters:  01/15/18 110/70  12/10/17 134/75  12/09/17 140/74   Weight loss: 10ibs loss noted in last 4weeks. Daughter reports constipation in last 1week, no improvement with sennakot. No nausea, no diarrhea, no fever, no dysphagia.  Reviewed past Medical, Social and Family history today.  Outpatient Medications Prior to Visit  Medication Sig Dispense Refill  . Calcium Acetate, Phos Binder, (CALCIUM ACETATE PO) Take 600 mg by mouth 2 (two) times daily.    . carbamide peroxide (DEBROX) 6.5 % OTIC solution Place 5 drops into both ears 2 (two) times daily. 15 mL 0  . Cholecalciferol (VITAMIN D PO) Take 800 Units by mouth daily.    . clotrimazole-betamethasone (LOTRISONE) cream Apply 1 application topically 2 (two) times daily. 45 g 1  . diclofenac sodium (VOLTAREN) 1 % GEL Apply topically.    Marland Kitchen etanercept (ENBREL SURECLICK) 50 MG/ML injection Inject into the skin.    Marland Kitchen FLUoxetine (PROZAC) 10 MG capsule TAKE ONE CAPSULE BY MOUTH daily. Takes total of 50mg  once a day 90 capsule 3  . FLUoxetine (PROZAC) 40 MG capsule Take 1 capsule (40 mg total) by mouth daily. Takes total of 50mg  daily 90 capsule 3  . folic acid (FOLVITE) 1 MG tablet Take 1 mg by mouth daily.  6  . furosemide (LASIX) 20 MG tablet Take 1 tablet (20 mg total) by mouth daily. Needs office visit for additional refills 90 tablet 3  . ipratropium (ATROVENT) 0.03 %  nasal spray Place 1 spray into both nostrils 2 (two) times daily. Do not use for more than 5days. 30 mL 0  . methotrexate (RHEUMATREX) 2.5 MG tablet Take 12.5 mg by mouth once a week. 5 tablets every Thursday    . miconazole (MICOTIN) 2 % powder Apply topically 2 (two) times daily as needed for itching. 70 g 1  . potassium chloride SA (K-DUR,KLOR-CON) 20 MEQ tablet Take 1 tablet (20 mEq total) by mouth daily. 90 tablet 1  . predniSONE (DELTASONE) 5 MG tablet TAKE ONE-HALF TO ONE tablet daily FOR 30 DAYS  3  . risperiDONE (RISPERDAL) 0.5 MG tablet 1 tablet in the morning, 2 in the evening 90 tablet 1  . simvastatin (ZOCOR) 20 MG tablet TAKE ONE TABLET BY MOUTH nighly 90 tablet 2  . amLODipine (NORVASC) 10 MG tablet Take 1 tablet (10 mg total) by mouth daily. 90 tablet 3   No facility-administered medications prior to visit.     ROS See HPI  Objective:  BP 110/70   Pulse (!) 104   Temp 98.8 F (37.1 C) (Oral)   Ht 5\' 2"  (1.575 m)   Wt 124 lb 12.8 oz (56.6 kg)   SpO2 94%   BMI 22.83 kg/m   BP Readings from Last 3 Encounters:  01/15/18 110/70  12/10/17 134/75  12/09/17 140/74    Wt Readings from Last 3 Encounters:  01/15/18 124 lb 12.8 oz (56.6 kg)  12/10/17 134 lb (60.8 kg)  12/09/17 135  lb (61.2 kg)    Physical Exam Vitals signs reviewed.  Cardiovascular:     Rate and Rhythm: Normal rate and regular rhythm.  Pulmonary:     Effort: Pulmonary effort is normal.     Breath sounds: Normal breath sounds.  Abdominal:     General: Bowel sounds are normal.     Palpations: Abdomen is soft. There is no mass.     Tenderness: There is no abdominal tenderness. There is no guarding.  Musculoskeletal:     Right lower leg: No edema.     Left lower leg: No edema.  Neurological:     Mental Status: She is alert.  Psychiatric:        Mood and Affect: Mood normal.        Behavior: Behavior normal.    Lab Results  Component Value Date   GLUCOSE 107 (H) 09/09/2017   CHOL 145  03/20/2017   TRIG 90.0 03/20/2017   HDL 75.50 03/20/2017   LDLCALC 52 03/20/2017   ALT 12 09/09/2017   AST 15 09/09/2017   NA 139 09/09/2017   K 4.8 09/09/2017   CL 101 09/09/2017   CREATININE 1.05 09/09/2017   BUN 23 09/09/2017   CO2 26 09/09/2017   TSH 1.87 09/09/2017   HGBA1C 5.1 03/20/2017    Mr Brain Wo Contrast  Result Date: 04/19/2017  [facility] [address], [address] [place], [place] [address] [phone] NEUROIMAGING REPORT STUDY DATE:04/18/2017 PATIENT NAME: Tricia Daniel DOB: [birth year] MRN: [id number] ORDERING CLINICIAN: Dr Anne Hahn CLINICAL HISTORY:  75 year patient with memory loss COMPARISON FILMS: none EXAM: MRI Brain wo TECHNIQUE: MRI of the brain without contrast was obtained utilizing 5 mm axial slices with T1, T2, T2 flair, T2 star gradient echo and diffusion weighted views.  T1 sagittal and T2 coronal views were obtained.Study is compromised by motion artefacts CONTRAST: none IMAGING SITE: [facility] FINDINGS: The study is compromised by motion artefacts.The brain parenchyma shows mild changes of chronic microvascular ischemia.No abnormal lesions are seen on diffusion-weighted views to suggest acute ischemia. The cortical sulci, fissures and cisterns are normal in size and appearance. Lateral, third and fourth ventricle are normal in size and appearance. No extra-axial fluid collections are seen. No evidence of mass effect or midline shift.  On sagittal views the posterior fossa, pituitary gland and corpus callosum are unremarkable. No evidence of intracranial hemorrhage on gradient-echo views. The orbits and their contents, paranasal sinuses and calvarium are unremarkable.  Intracranial flow voids are present.There are mild chanfges of chronicparanasal sinusitis. Upper cervical spine shows mild disc degenerative changes but no compression    Unremarkable MRI Brain wo except for mild age related changes of chronic microvascular ischemia Study  is  compromised by motion artefacts. INTERPRETING PHYSICIAN: Delia Heady, MD Certified in  Neuroimaging by American Society of Neuroimaging and [employer]    Assessment & Plan:   Tricia Daniel was seen today for follow-up.  Diagnoses and all orders for this visit:  Weight loss, unintentional -     Urinalysis w microscopic + reflex cultur  Essential hypertension -     amLODipine (NORVASC) 10 MG tablet; Take 0.5 tablets (5 mg total) by mouth daily.  Constipation, unspecified constipation type -     lactulose (CHRONULAC) 10 GM/15ML solution; Take 15 mLs (10 g total) by mouth 2 (two) times daily as needed for moderate constipation.   I have changed Tricia Daniel Parkinson's amLODipine. I am also having her start on lactulose.  Additionally, I am having her maintain her predniSONE, folic acid, methotrexate, diclofenac sodium, etanercept, miconazole, clotrimazole-betamethasone, carbamide peroxide, Cholecalciferol (VITAMIN D PO), (Calcium Acetate, Phos Binder, (CALCIUM ACETATE PO)), FLUoxetine, FLUoxetine, potassium chloride SA, furosemide, ipratropium, risperiDONE, and simvastatin.  Meds ordered this encounter  Medications  . lactulose (CHRONULAC) 10 GM/15ML solution    Sig: Take 15 mLs (10 g total) by mouth 2 (two) times daily as needed for moderate constipation.    Dispense:  236 mL    Refill:  0    Order Specific Question:   Supervising Provider    Answer:   Dianne DunARON, TALIA M [[id number]]  . amLODipine (NORVASC) 10 MG tablet    Sig: Take 0.5 tablets (5 mg total) by mouth daily.    Dispense:  90 tablet    Refill:  3    Order Specific Question:   Supervising Provider    Answer:   Dianne DunARON, TALIA M [[id number]]    Problem List Items Addressed This Visit      Cardiovascular and Mediastinum   Essential hypertension   Relevant Medications   amLODipine (NORVASC) 10 MG tablet     Other   Weight loss, unintentional - Primary   Relevant Orders   Urinalysis w microscopic + reflex cultur      Other Visit Diagnoses    Constipation, unspecified constipation type       Relevant Medications   lactulose (CHRONULAC) 10 GM/15ML solution      Follow-up: Return if symptoms worsen or fail to improve.  Alysia Pennaharlotte Arman Loy, NP

## 2018-01-15 NOTE — Patient Instructions (Addendum)
Will get recent lab results from rheumatology.  Will be contacted with lab results.  Go to lab to provided urine sample.  Decrease amlodipine to 5mg  (half tab)  Call office or send mychart message if BP >150/80 and weight continues to decline. consider CT ABD/pelvis if weight continues to decline

## 2018-01-16 ENCOUNTER — Encounter: Payer: Self-pay | Admitting: Physical Therapy

## 2018-01-16 ENCOUNTER — Encounter: Payer: Self-pay | Admitting: Nurse Practitioner

## 2018-01-16 ENCOUNTER — Ambulatory Visit: Payer: Medicare Other | Attending: Neurology | Admitting: Physical Therapy

## 2018-01-16 DIAGNOSIS — M6281 Muscle weakness (generalized): Secondary | ICD-10-CM | POA: Diagnosis present

## 2018-01-16 DIAGNOSIS — R2689 Other abnormalities of gait and mobility: Secondary | ICD-10-CM | POA: Diagnosis not present

## 2018-01-16 DIAGNOSIS — R296 Repeated falls: Secondary | ICD-10-CM | POA: Diagnosis present

## 2018-01-16 NOTE — Therapy (Signed)
[facility] [address] [place], [place], [address] Phone: [phone]   Fax:  [phone]  Physical Therapy Evaluation  Patient Details  Name: Tricia DecampMiriam Pesce MRN: [id number] Date of Birth: 12/19/1940 Referring Provider (PT): Willis,Charles MD   Encounter Date: 01/16/2018  PT End of Session - 01/16/18 1342    Visit Number  1    Number of Visits  16    Date for PT Re-Evaluation  03/13/18    Authorization Type  BCBS MCR    PT Start Time  1042    PT Stop Time  1145    PT Time Calculation (min)  63 min    Equipment Utilized During Treatment  Gait belt    Activity Tolerance  Patient limited by fatigue;Patient tolerated treatment well    Behavior During Therapy  [facility] for tasks assessed/performed;Flat affect       Past Medical History:  Diagnosis Date  . Allergy   . Arthritis   . Chronic kidney disease   . Dementia (HCC)   . Depression   . Gait abnormality 04/04/2017  . Hyperlipidemia   . Hypertension     Past Surgical History:  Procedure Laterality Date  . COLOSTOMY  1988  . COLOSTOMY REVERSAL  1999  . nephrectomy Left 1988  . TONSILLECTOMY      There were no vitals filed for this visit.   Subjective Assessment - 01/16/18 1310    Subjective  Pt has dementia and her daugher provided most of history (she lives with daughter and son in law who are caregivers), daugher reports that she was funcitoning at a much higher level 2 months ago, was walking household distances with Rollator, and was bathing/dressing herself and independent with transfers using hospital bed, grab bars, and rollator. She has had a sharp decline in function lately and she has lost weight, became unsteady on her feet and can now only walk 5 feet or so, she now needs help with all ADL's, including bathing and dressing, and her dementia seems to have progressed. They relay she does much better before 2 pm so morinig sessions are preferred for her.      Patient is accompained by:  Family member   daughter   Pertinent History  memory disturbance, hallucinations, incontinence,CKD,Dementia,    Limitations  Standing;Walking;House hold activities    How long can you stand comfortably?  2 min only    How long can you walk comfortably?  5 ft with rollator    Diagnostic tests  MRI from April unremarkable except for age related ischemic changes, new MRI has been ordered    Patient Stated Goals  daughter relays work on transferring, gait especially turning and pivoting, balance    Currently in Pain?  No/denies    Multiple Pain Sites  No         [facility] PT Assessment - 01/16/18 0001      Assessment   Medical Diagnosis  gait disorder with falls, deconditioning, dementia    Referring Provider (PT)  Willis,Charles MD    Onset Date/Surgical Date  --   sharp decline for last 2 months   Next MD Visit  ?    Prior Therapy  none      Precautions   Precautions  Fall      Restrictions   Weight Bearing Restrictions  No      Balance Screen   Has the patient fallen in the past 6 months  Yes  How many times?  3    Has the patient had a decrease in activity level because of a fear of falling?   Yes    Is the patient reluctant to leave their home because of a fear of falling?   Yes      Home Environment   Living Environment  Private residence    Available Help at Discharge  Family    Additional Comments  all handicap accessable with ramp, hospital bed, grap bars, etc      Prior Function   Level of Independence  Needs assistance with ADLs    Vocation  Retired    Leisure  puzzles, reading, watching TV, Bingo, cards      Cognition   Overall Cognitive Status  History of cognitive impairments - at baseline   dementia, follows simple commands 50% of the time     Sensation   Light Touch  Appears Intact      Coordination   Gross Motor Movements are Fluid and Coordinated  No    Finger Nose Finger Test  fair    Heel Shin Test  decreased on Lt  more than Rt      Posture/Postural Control   Posture Comments  slumed posture with fwd head and thoracic kyphosis      ROM / Strength   AROM / PROM / Strength  AROM;Strength      AROM   Overall AROM Comments  shoulder flex and abd to about 90 deg, LE/lumbar ROM WFL      Strength   Overall Strength Comments  difficult to assess due to not being able to follow comands for this, 4/5 grossly UE and LE      Transfers   Transfers  Supine to Sit;Sit to Supine;Stand Pivot Transfers;Stand to Sit    Five time sit to stand comments   31   31 sec with Rollator   Stand to Sit  5: Supervision;With upper extremity assist   initially mod A, progressed to supervision after practice   Stand to Sit Details (indicate cue type and reason)  Tactile cues for initiation;Tactile cues for sequencing;Tactile cues for weight shifting;Tactile cues for posture;Tactile cues for placement;Visual cues for safe use of DME/AE;Verbal cues for sequencing;Verbal cues for technique;Verbal cues for precautions/safety    Stand Pivot Transfers  3: Mod assist    Stand Pivot Transfer Details (indicate cue type and reason)  with RW, needs mod A for rollator management and to advace Rt leg for pivot/turn,    Supine to Sit  3: Mod assist    Sit to Supine  3: Mod assist      Ambulation/Gait   Ambulation/Gait  Yes    Ambulation/Gait Assistance  3: Mod assist    Ambulation/Gait Assistance Details  --   for rollator managment, and intermitt advancement of Rt LE   Ambulation Distance (Feet)  10 Feet    Assistive device  Rollator    Gait Pattern  --   short shuffling pattern, very unstable and freezes at time   Ambulation Surface  Level;Indoor    Gait velocity  3 min for 10 ft      Balance   Balance Assessed  Yes      Standardized Balance Assessment   Standardized Balance Assessment  Timed Up and Go Test;Five Times Sit to Stand    Five times sit to stand comments   see above      Timed Up and Go Test   TUG  --  attempted, got 5 ft out then she would not walk any further     High Level Balance   High Level Balance Comments  able to stand feet narrow eyes open for 15 sec (not able to achieve feet together position), able to stand feet apart eyes closed for 10 sec. Will attempt BERG in future when more time allows                Objective measurements completed on examination: See above findings.              PT Education - 01/16/18 1341    Education Details  HEP, POC, exam findings, transfer training tips for caregiver    Person(s) Educated  Patient;Child(ren)    Methods  Explanation;Demonstration;Tactile cues;Verbal cues;Handout    Comprehension  Verbalized understanding;Need further instruction       PT Short Term Goals - 01/16/18 1354      PT SHORT TERM GOAL #1   Title  Pt will be I and compliant with HEP. 4 weeks 02/16/18    Status  New      PT SHORT TERM GOAL #2   Title  Pt will peform TUG and BERG to further assess balance and will set goal for these accordingly. 4 weeks 02/16/18    Status  New        PT Long Term Goals - 01/16/18 1356      PT LONG TERM GOAL #1   Title  Pt will increase 5TSTS with UE assistance,  time to less than 20 sec to show improved balance and strength. 8 weeks 03/13/18    Status  New      PT LONG TERM GOAL #2   Title  Pt will be able to ambulate at least 50 ft with supervision and rollator/RW for household ambulation. 8 weeks 03/13/18      PT LONG TERM GOAL #3   Title  Pt will be able to perform transfers(sit to stand, stand pivot, and bed mobility) with min assist or better to decrease caregiver burden. 8 weeks 03/13/18      PT LONG TERM GOAL #4   Title  Balance goal will be added in future             Plan - 01/16/18 1344    Clinical Impression Statement  Pt presents with gait and balance disorder, deconditioning, recent falls, and progression of her dementia. She has had rapid decline over the last 2 months and is now  dependent on all ADL's, uses wheelchair for locomotion and can only walk 5 ft with Rollator. She has a new MRI that has been ordered on her brain. She has decreased strength, decreased ROM, decreased balance, decreased safety awareness, decreased stabiliy for gait, cognitive deficits with dementia, and decresaed functional ability for ADL's and transfers. She is pleasent and shows good effort but gets confused at times and freezes duing activity as well as she fatiques very quickly. She will benefit from skilled PT to address her deficits.     History and Personal Factors relevant to plan of care:  memory disturbance, hallucinations, incontinence,CKD,Dementia,    Clinical Presentation  Unstable    Clinical Presentation due to:  varrying peformance and cognition, rapid decline over the last 2 months.     Clinical Decision Making  High    Rehab Potential  Fair    Clinical Impairments Affecting Rehab Potential  cognitive impairments, dementia    PT Frequency  2x / week  PT Duration  8 weeks    PT Treatment/Interventions  ADLs/Self Care Home Management;Moist Heat;Gait training;Stair training;Functional mobility training;Therapeutic activities;Therapeutic exercise;Balance training;Neuromuscular re-education;Cognitive remediation;Patient/family education;Wheelchair mobility training;Manual techniques;Passive range of motion;Energy conservation    PT Next Visit Plan  may try TUG or BERG, this was attemped after transfer training and 5TSTS and she was too tired to perform these but also difficulty with following commands. Work on Teacher, early years/pre, gait, balance, general strength and standing tolerance    PT Home Exercise Plan  [email or web address]    Consulted and Agree with Plan of Care  Patient;Family member/caregiver    Family Member Consulted  daughter       Patient will benefit from skilled therapeutic intervention in order to improve the following deficits and impairments:  Abnormal gait, Decreased activity  tolerance, Decreased endurance, Decreased balance, Decreased knowledge of precautions, Decreased safety awareness, Decreased range of motion, Decreased strength, Difficulty walking, Postural dysfunction, Pain  Visit Diagnosis: Other abnormalities of gait and mobility  Repeated falls  Muscle weakness (generalized)     Problem List Patient Active Problem List   Diagnosis Date Noted  . Hallucinations 12/09/2017  . Weight loss, unintentional 09/09/2017  . Gait abnormality 04/04/2017  . Mixed hyperlipidemia 03/21/2017  . Polycystic kidney disease 03/20/2017  . Essential hypertension 03/20/2017  . Asymptomatic postmenopausal estrogen deficiency 03/20/2017  . Bilateral hearing loss 03/20/2017  . Depression, major, single episode, complete remission (HCC) 03/20/2017  . Memory deficit 03/20/2017  . H/O unilateral nephrectomy 03/20/2017  . S/P partial colectomy 03/20/2017  . Chronic cystitis 06/27/2013  . Congenital polycystic kidney 07/07/2012  . Acquired absence of kidney 07/07/2012    Birdie Riddle 01/16/2018, 2:03 PM  [facility] [address] [place], [place], [address] Phone: [phone]   Fax:  [phone]  Name: Genisis Yurchak MRN: [id number] Date of Birth: 04-27-1940

## 2018-01-16 NOTE — Patient Instructions (Signed)
Access Code: [email or web address]  URL: [email or web address]  Date: 01/16/2018  Prepared by: Ivery Quale   Exercises  Seated Long Arc Quad - 10 reps - 2-3 sets - 2x daily - 6x weekly  Seated March - 10 reps - 2-3 sets - 2x daily - 6x weekly  Sit to Stand with Counter Support - 5 reps - 2-3 sets - 2x daily - 6x weekly  Side Stepping with Counter Support - 3-5 reps - 1 sets - 2x daily - 6x weekly  Standing Balance in Corner - 3 reps - 1 sets - 20 hold - 2x daily - 6x weekly

## 2018-01-17 LAB — URINALYSIS W MICROSCOPIC + REFLEX CULTURE
Bacteria, UA: NONE SEEN /HPF
Bilirubin Urine: NEGATIVE
Glucose, UA: NEGATIVE
HYALINE CAST: NONE SEEN /LPF
Ketones, ur: NEGATIVE
Nitrites, Initial: NEGATIVE
PROTEIN: NEGATIVE
Specific Gravity, Urine: 1.01 (ref 1.001–1.03)
pH: 7 (ref 5.0–8.0)

## 2018-01-17 LAB — URINE CULTURE
MICRO NUMBER:: 38718
SPECIMEN QUALITY:: ADEQUATE

## 2018-01-17 LAB — CULTURE INDICATED

## 2018-01-19 ENCOUNTER — Encounter: Payer: Self-pay | Admitting: Nurse Practitioner

## 2018-01-19 ENCOUNTER — Other Ambulatory Visit: Payer: Self-pay | Admitting: Neurology

## 2018-01-19 MED ORDER — DONEPEZIL HCL 5 MG PO TABS: 5.0000 mg | ORAL_TABLET | Freq: Every day | ORAL | 1 refills | Status: AC

## 2018-01-21 ENCOUNTER — Encounter: Payer: Self-pay | Admitting: Physical Therapy

## 2018-01-21 ENCOUNTER — Ambulatory Visit: Payer: Medicare Other | Admitting: Physical Therapy

## 2018-01-21 DIAGNOSIS — M6281 Muscle weakness (generalized): Secondary | ICD-10-CM

## 2018-01-21 DIAGNOSIS — R2689 Other abnormalities of gait and mobility: Secondary | ICD-10-CM

## 2018-01-21 DIAGNOSIS — R296 Repeated falls: Secondary | ICD-10-CM

## 2018-01-21 NOTE — Therapy (Signed)
[facility] [address] [place], [place], [address] Phone: [phone]   Fax:  [phone]  Physical Therapy Treatment  Patient Details  Name: Tricia DecampMiriam Dershem MRN: [id number] Date of Birth: 08/15/1940 Referring Provider (PT): Willis,Charles MD   Encounter Date: 01/21/2018  PT End of Session - 01/21/18 1331    Visit Number  2    Number of Visits  16    Date for PT Re-Evaluation  03/13/18    Authorization Type  [employer] MCR - 10th visit PN    PT Start Time  1105    PT Stop Time  1153    PT Time Calculation (min)  48 min    Activity Tolerance  Patient limited by fatigue    Behavior During Therapy  [facility] for tasks assessed/performed;Flat affect       Past Medical History:  Diagnosis Date  . Allergy   . Arthritis   . Chronic kidney disease   . Dementia (HCC)   . Depression   . Gait abnormality 04/04/2017  . Hyperlipidemia   . Hypertension     Past Surgical History:  Procedure Laterality Date  . COLOSTOMY  1988  . COLOSTOMY REVERSAL  1999  . nephrectomy Left 1988  . TONSILLECTOMY      There were no vitals filed for this visit.  Subjective Assessment - 01/21/18 1315    Subjective  Pt in w/c from home with severe sacral sitting and pelvis slid forwards in chair.  Daughter reports this chair was her father's and they may request an order for a new chair.  Daughter reports that pt's decline began after she was started on Risperdal.    Patient is accompained by:  Family member   daughter   Pertinent History  memory disturbance, hallucinations, incontinence,CKD,Dementia,    Limitations  Standing;Walking;House hold activities    How long can you stand comfortably?  2 min only    How long can you walk comfortably?  5 ft with rollator    Diagnostic tests  MRI from April unremarkable except for age related ischemic changes, new MRI has been ordered    Patient Stated Goals  daughter relays work on transferring, gait  especially turning and pivoting, balance    Currently in Pain?  No/denies         [facility] PT Assessment - 01/21/18 1113      Standardized Balance Assessment   Standardized Balance Assessment  Berg Balance Test;Timed Up and Go Test      Berg Balance Test   Sit to Stand  Needs moderate or maximal assist to stand    Standing Unsupported  Unable to stand 30 seconds unassisted    Sitting with Back Unsupported but Feet Supported on Floor or Stool  Able to sit 2 minutes under supervision    Stand to Sit  Needs assistance to sit    Transfers  Needs one person to assist    Standing Unsupported with Eyes Closed  Needs help to keep from falling    Standing Ubsupported with Feet Together  Needs help to attain position and unable to hold for 15 seconds    From Standing, Reach Forward with Outstretched Arm  Loses balance while trying/requires external support    From Standing Position, Pick up Object from Floor  Unable to try/needs assist to keep balance    From Standing Position, Turn to Look Behind Over each Shoulder  Needs supervision when turning      Timed Up  and Go Test   Normal TUG (seconds)  3.09    TUG Comments  with rollator                   [facility] Adult PT Treatment/Exercise - 01/21/18 1317      Transfers   Transfers  Stand to Sit;Sit to Stand    Sit to Stand  4: Min assist    Sit to Stand Details (indicate cue type and reason)  min A to shift COG far enough forwards to push to stand;  Pt very fearful of falling forwards and wants to have UE support on RW handles or pull to stand on RW    Stand to Sit  4: Min assist    Stand to Sit Details  does not pivot fully before initiating sitting    Stand Pivot Transfers  3: Mod assist    Stand Pivot Transfer Details (indicate cue type and reason)  with rollator    Transfer Cueing  During stand pivot pt performs very slowly and with shuffling feet; does not pivot fully or step laterally fully or posterior fully to line up with seat       Therapeutic Activites    Therapeutic Activities  Other Therapeutic Activities    Other Therapeutic Activities  Adjusted w/c leg rests up (shortened) to allow pt to reach foot plates and scoot self more posterior in seat.  Also demonstrated to daughter how to assist pt by having pt lean fullly forwards and lifting slightly under hips to unweight while pt pushes posterior.  Discussed sitting posture in w/c and other adjustments that could be made to chair to improve positioning.  Daughter may take w/c by [facility] and have them adjust the w/c and may request order for new chair from Neurologist.   Discussed medication changes around the time of patient's decline.  Daughter has not discussed the functional decline with neurologist; encouraged daughter to discuss changes in mobility and function with neurologist and discuss if this could be a side effect of new medication.             PT Education - 01/21/18 1329    Education Details  See TA       PT Short Term Goals - 01/21/18 1336      PT SHORT TERM GOAL #1   Title  Pt will perform HEP consistently with daughter 3x/week.  02/16/18    Status  Revised      PT SHORT TERM GOAL #2   Title  Pt will peform TUG and BERG to further assess balance and will set goal for these accordingly. 4 weeks 02/16/18    Baseline  TUG completed, still assessing BERG    Status  On-going        PT Long Term Goals - 01/21/18 1337      PT LONG TERM GOAL #1   Title  Pt will increase 5TSTS with UE assistance,  time to less than 25 sec to show improved balance and strength. 8 weeks 03/13/18    Baseline  30 seconds with UE on rollator    Status  Revised      PT LONG TERM GOAL #2   Title  Pt will be able to ambulate at least 50 ft with Min A and rollator/RW for household ambulation. 8 weeks 03/13/18    Baseline  mod A, 10 feet    Status  Revised      PT LONG TERM GOAL #3  Title  Pt will be able to perform transfers(sit to stand, stand pivot, and bed  mobility) with min assist or better to decrease caregiver burden. 8 weeks 03/13/18    Baseline  min-mod A with rollator    Status  Revised      PT LONG TERM GOAL #4   Title  Pt will improve BERG score by 4 points demonstrating decreased reliance on UE    Baseline  TBD    Status  New    Target Date  03/13/18      PT LONG TERM GOAL #5   Title  Pt will improve TUG with rollator by 30 seconds demonstrating improved balance and safety    Baseline  3.09 minutes    Status  New    Target Date  03/13/18            Plan - 01/21/18 1331    Clinical Impression Statement  Increased time spent discussing positioning in w/c and adjustments that can be made to w/c to improve positioning and posture as well as recent functional decline.  Daughter to follow up with [facility] and Neurologist.  Rest of treatment session focused on continued assessment of transfers, gait, balance and falls risk with TUG with rollator and initiated BERG.  Unable to complete BERG due to pt sitting between each task.  Pt unable to release hand hold on rollator or therapist for BERG tasks due to increased fear of falling forwards.  Will complete BERG and will continue to address impairments to progress towards LTG.    Rehab Potential  Fair    Clinical Impairments Affecting Rehab Potential  cognitive impairments, dementia    PT Frequency  2x / week    PT Duration  8 weeks    PT Treatment/Interventions  ADLs/Self Care Home Management;Moist Heat;Gait training;Stair training;Functional mobility training;Therapeutic activities;Therapeutic exercise;Balance training;Neuromuscular re-education;Cognitive remediation;Patient/family education;Wheelchair mobility training;Manual techniques;Passive range of motion;Energy conservation    PT Next Visit Plan  finish BERG; update and review HEP (add reaching forwards at counter, side stepping, trunk rotation at counter top).   Transfer training, gait training with rollator.  Changes to w/c?  Did  daughter discuss medication with neurologist.    PT Home Exercise Plan  [id number]    Consulted and Agree with Plan of Care  Patient;Family member/caregiver    Family Member Consulted  daughter       Patient will benefit from skilled therapeutic intervention in order to improve the following deficits and impairments:  Abnormal gait, Decreased activity tolerance, Decreased endurance, Decreased balance, Decreased knowledge of precautions, Decreased safety awareness, Decreased range of motion, Decreased strength, Difficulty walking, Postural dysfunction, Pain  Visit Diagnosis: Other abnormalities of gait and mobility  Repeated falls  Muscle weakness (generalized)     Problem List Patient Active Problem List   Diagnosis Date Noted  . Hallucinations 12/09/2017  . Weight loss, unintentional 09/09/2017  . Gait abnormality 04/04/2017  . Mixed hyperlipidemia 03/21/2017  . Polycystic kidney disease 03/20/2017  . Essential hypertension 03/20/2017  . Asymptomatic postmenopausal estrogen deficiency 03/20/2017  . Bilateral hearing loss 03/20/2017  . Depression, major, single episode, complete remission (HCC) 03/20/2017  . Memory deficit 03/20/2017  . H/O unilateral nephrectomy 03/20/2017  . S/P partial colectomy 03/20/2017  . Chronic cystitis 06/27/2013  . Congenital polycystic kidney 07/07/2012  . Acquired absence of kidney 07/07/2012    Dierdre HighmanAudra F Rueben Kassim, PT, DPT 01/21/18    1:41 PM    [facility] [address] [place], [place], [address] Phone: [phone]   Fax:  [phone]  Name: Alessi Warr MRN: [id number] Date of Birth: 1940/08/26

## 2018-01-23 ENCOUNTER — Ambulatory Visit: Payer: Medicare Other | Admitting: Physical Therapy

## 2018-01-27 ENCOUNTER — Ambulatory Visit: Payer: Medicare Other | Admitting: Physical Therapy

## 2018-01-27 DIAGNOSIS — R296 Repeated falls: Secondary | ICD-10-CM

## 2018-01-27 DIAGNOSIS — R2689 Other abnormalities of gait and mobility: Secondary | ICD-10-CM | POA: Diagnosis not present

## 2018-01-27 DIAGNOSIS — M6281 Muscle weakness (generalized): Secondary | ICD-10-CM

## 2018-01-27 NOTE — Therapy (Signed)
[facility] [address] [place], [place], [address] Phone: [phone]   Fax:  [phone]  Physical Therapy Treatment  Patient Details  Name: Tricia DecampMiriam Daniel MRN: [id number] Date of Birth: 06/01/1940 Referring Provider (PT): Willis,Charles MD   Encounter Date: 01/27/2018  PT End of Session - 01/27/18 1334    Visit Number  3    Number of Visits  16    Date for PT Re-Evaluation  03/13/18    Authorization Type  [employer] MCR - 10th visit PN    PT Start Time  1145    PT Stop Time  1230    PT Time Calculation (min)  45 min    Equipment Utilized During Treatment  Gait belt    Activity Tolerance  Patient limited by fatigue    Behavior During Therapy  [facility] for tasks assessed/performed;Flat affect       Past Medical History:  Diagnosis Date  . Allergy   . Arthritis   . Chronic kidney disease   . Dementia (HCC)   . Depression   . Gait abnormality 04/04/2017  . Hyperlipidemia   . Hypertension     Past Surgical History:  Procedure Laterality Date  . COLOSTOMY  1988  . COLOSTOMY REVERSAL  1999  . nephrectomy Left 1988  . TONSILLECTOMY      There were no vitals filed for this visit.  Subjective Assessment - 01/27/18 1323    Subjective  Pt not very vocal today, daughter expresses she has been declining with moblity, talking, swallowing over the last week, she was encouraged to follow up with MD about this decline in status and change in meds.     Patient is accompained by:  Family member   daughter   Pertinent History  memory disturbance, hallucinations, incontinence,CKD,Dementia,    Limitations  Standing;Walking;House hold activities    Diagnostic tests  MRI from April unremarkable except for age related ischemic changes, new MRI has been ordered    Patient Stated Goals  daughter relays work on transferring, gait especially turning and pivoting, balance    Currently in Pain?  No/denies         [facility] PT Assessment -  01/27/18 0001      Berg Balance Test   Sit to Stand  Needs moderate or maximal assist to stand    Standing Unsupported  Unable to stand 30 seconds unassisted    Sitting with Back Unsupported but Feet Supported on Floor or Stool  Able to sit 2 minutes under supervision    Stand to Sit  Needs assistance to sit    Transfers  Needs one person to assist    Standing Unsupported with Eyes Closed  Needs help to keep from falling    Standing Ubsupported with Feet Together  Needs help to attain position and unable to hold for 15 seconds    From Standing, Reach Forward with Outstretched Arm  Loses balance while trying/requires external support    From Standing Position, Pick up Object from Floor  Unable to try/needs assist to keep balance    From Standing Position, Turn to Look Behind Over each Shoulder  Needs supervision when turning    Turn 360 Degrees  Needs assistance while turning    Standing Unsupported, Alternately Place Feet on Step/Stool  Needs assistance to keep from falling or unable to try    Standing Unsupported, One Foot in Baker Hughes IncorporatedFront  Loses balance while stepping or standing    Standing on One  Leg  Unable to try or needs assist to prevent fall    Total Score  5                   [facility] Adult PT Treatment/Exercise - 01/27/18 0001      Transfers   Transfers  Stand to Sit;Sit to Stand    Sit to Stand  4: Min assist;3: Mod assist    Sit to Stand Details (indicate cue type and reason)  verbal and tactile cues for placement of Lt hand on Rollator, and Rt hand on arm rests to push up from chair along with min A for fwd wt shift needed as she wants to push back posteriorly today   some reps min A, some reps mod A, 10 reps total spread out   Stand to Sit  4: Min assist    Stand to Sit Details  needs assist to reach back and find chair with UE    Number of Reps  10 reps   spread out during session     Ambulation/Gait   Ambulation/Gait  Yes    Ambulation/Gait Assistance  2: Max  assist    Ambulation Distance (Feet)  10 Feet    Assistive device  Other (Comment)   both UE support on PT shoulders   Gait Comments  not able to use rollator today due to posterior lean      Exercises   Exercises  Other Exercises    Other Exercises   sitting in chair LAQ and marches X 10 each, sitting shoulder rows, flexion, abd  X10 ea, standing marching and hip abd at counter  X10 each (needed manual facilitation at times), sidestepping at small counter up/down X 2             PT Education - 01/27/18 1334    Education Details  instructions to F/U with MD about decline in function    Person(s) Educated  Patient;Child(ren)    Methods  Explanation    Comprehension  Verbalized understanding       PT Short Term Goals - 01/21/18 1336      PT SHORT TERM GOAL #1   Title  Pt will perform HEP consistently with daughter 3x/week.  02/16/18    Status  Revised      PT SHORT TERM GOAL #2   Title  Pt will peform TUG and BERG to further assess balance and will set goal for these accordingly. 4 weeks 02/16/18    Baseline  TUG completed, still assessing BERG    Status  On-going        PT Long Term Goals - 01/21/18 1337      PT LONG TERM GOAL #1   Title  Pt will increase 5TSTS with UE assistance,  time to less than 25 sec to show improved balance and strength. 8 weeks 03/13/18    Baseline  30 seconds with UE on rollator    Status  Revised      PT LONG TERM GOAL #2   Title  Pt will be able to ambulate at least 50 ft with Min A and rollator/RW for household ambulation. 8 weeks 03/13/18    Baseline  mod A, 10 feet    Status  Revised      PT LONG TERM GOAL #3   Title  Pt will be able to perform transfers(sit to stand, stand pivot, and bed mobility) with min assist or better to decrease caregiver burden. 8 weeks 03/13/18  Baseline  min-mod A with rollator    Status  Revised      PT LONG TERM GOAL #4   Title  Pt will improve BERG score by 4 points demonstrating decreased reliance on UE     Baseline  TBD    Status  New    Target Date  03/13/18      PT LONG TERM GOAL #5   Title  Pt will improve TUG with rollator by 30 seconds demonstrating improved balance and safety    Baseline  3.09 minutes    Status  New    Target Date  03/13/18            Plan - 01/27/18 1336    Clinical Impression Statement  Pt had decline in performance today in all areas, she was not very verbal or interactive today, she had difficulty swallowing and needed PT to assist wiping saliva off her mouth. She could not ambulate with RW today due to posterior push back and requried max A from PT for ambulation holding onto PT shoulders. She could not stand without any UE support and could not get good attempt for remaining BERG balance test but she is severe falls risk. She could however perform some standing exercises at counter top where she could hold on and seemed more secure but could not perform these just using rollator with UE support. PT advised her and daugher to follow up with MD due to this rapid decline in functional status over the last week.     Rehab Potential  Fair    Clinical Impairments Affecting Rehab Potential  cognitive impairments, dementia    PT Frequency  2x / week    PT Duration  8 weeks    PT Treatment/Interventions  ADLs/Self Care Home Management;Moist Heat;Gait training;Stair training;Functional mobility training;Therapeutic activities;Therapeutic exercise;Balance training;Neuromuscular re-education;Cognitive remediation;Patient/family education;Wheelchair mobility training;Manual techniques;Passive range of motion;Energy conservation    PT Next Visit Plan  finish BERG; update and review HEP (add reaching forwards at counter, side stepping, trunk rotation at counter top).   Transfer training, gait training with rollator.  Changes to w/c?  Did daughter discuss medication with neurologist.    PT Home Exercise Plan  [facility]    Consulted and Agree with Plan of Care  Patient;Family  member/caregiver    Family Member Consulted  daughter       Patient will benefit from skilled therapeutic intervention in order to improve the following deficits and impairments:     Visit Diagnosis: Other abnormalities of gait and mobility  Repeated falls  Muscle weakness (generalized)     Problem List Patient Active Problem List   Diagnosis Date Noted  . Hallucinations 12/09/2017  . Weight loss, unintentional 09/09/2017  . Gait abnormality 04/04/2017  . Mixed hyperlipidemia 03/21/2017  . Polycystic kidney disease 03/20/2017  . Essential hypertension 03/20/2017  . Asymptomatic postmenopausal estrogen deficiency 03/20/2017  . Bilateral hearing loss 03/20/2017  . Depression, major, single episode, complete remission (HCC) 03/20/2017  . Memory deficit 03/20/2017  . H/O unilateral nephrectomy 03/20/2017  . S/P partial colectomy 03/20/2017  . Chronic cystitis 06/27/2013  . Congenital polycystic kidney 07/07/2012  . Acquired absence of kidney 07/07/2012    Birdie Riddle 01/27/2018, 1:43 PM  [facility] [address] [place], [place], [address] Phone: [phone]   Fax:  [phone]  Name: Tricia Daniel MRN: [id number] Date of Birth: 05-31-1940

## 2018-01-29 ENCOUNTER — Encounter: Payer: Self-pay | Admitting: Nurse Practitioner

## 2018-01-29 ENCOUNTER — Ambulatory Visit: Payer: Medicare Other | Admitting: Physical Therapy

## 2018-01-29 ENCOUNTER — Encounter: Payer: Self-pay | Admitting: Physical Therapy

## 2018-01-29 DIAGNOSIS — M6281 Muscle weakness (generalized): Secondary | ICD-10-CM

## 2018-01-29 DIAGNOSIS — R2689 Other abnormalities of gait and mobility: Secondary | ICD-10-CM | POA: Diagnosis not present

## 2018-01-29 DIAGNOSIS — R296 Repeated falls: Secondary | ICD-10-CM

## 2018-01-29 LAB — HEPATITIS PANEL, ACUTE
HBsAg Screen: NEGATIVE
Hep A Ab, IgM: NEGATIVE
Hep B Core Ab, IgM: NEGATIVE
Hepatitis C Ab: NEGATIVE

## 2018-01-29 NOTE — Progress Notes (Signed)
Abstracted result and sent to scan  

## 2018-01-30 LAB — BASIC METABOLIC PANEL
BUN: 26 — AB (ref 4–21)
Creatinine: 1 (ref 0.5–1.1)
Glucose: 119
Potassium: 3.4 (ref 3.4–5.3)
Sodium: 141 (ref 137–147)

## 2018-01-30 LAB — HEPATIC FUNCTION PANEL
ALT: 11 (ref 7–35)
AST: 21 (ref 13–35)
Bilirubin, Total: 0.5

## 2018-01-30 LAB — CBC AND DIFFERENTIAL
HCT: 40 (ref 36–46)
HCT: 40 (ref 36–46)
HEMOGLOBIN: 13.2 (ref 12.0–16.0)
Hemoglobin: 13.2 (ref 12.0–16.0)
Neutrophils Absolute: 11
Platelets: 218 (ref 150–399)
Platelets: 218 (ref 150–399)
WBC: 13.4
WBC: 13.4

## 2018-02-01 NOTE — Therapy (Signed)
[facility] [address] [place], [place], [address] Phone: [phone]   Fax:  [phone]  Physical Therapy Treatment  Patient Details  Name: Tricia Daniel MRN: [id number] Date of Birth: 01/09/1940 Referring Provider (PT): Willis,Charles MD   Encounter Date: 01/29/2018     01/29/18 1109  PT Visits / Re-Eval  Visit Number 4  Number of Visits 16  Date for PT Re-Evaluation 03/13/18  Authorization  Authorization Type BCBS MCR - 10th visit PN  PT Time Calculation  PT Start Time 1105  PT Stop Time 1145  PT Time Calculation (min) 40 min  PT - End of Session  Equipment Utilized During Treatment Gait belt  Activity Tolerance Patient tolerated treatment well;No increased pain;Patient limited by fatigue  Behavior During Therapy [facility] for tasks assessed/performed;Flat affect    Past Medical History:  Diagnosis Date  . Allergy   . Arthritis   . Chronic kidney disease   . Dementia (HCC)   . Depression   . Gait abnormality 04/04/2017  . Hyperlipidemia   . Hypertension     Past Surgical History:  Procedure Laterality Date  . COLOSTOMY  1988  . COLOSTOMY REVERSAL  1999  . nephrectomy Left 1988  . TONSILLECTOMY      There were no vitals filed for this visit.     01/29/18 1108  Symptoms/Limitations  Subjective Has appt with neuro MD on Tuesday for follow up due to recent decline with mobility. Pt more alert and conversational today. No falls.   Patient is accompained by: Family member  Pertinent History memory disturbance, hallucinations, incontinence,CKD,Dementia,  Limitations Standing;Walking;House hold activities  How long can you stand comfortably? 2 min only  How long can you walk comfortably? 5 ft with rollator  Diagnostic tests MRI from April unremarkable except for age related ischemic changes, new MRI has been ordered  Patient Stated Goals daughter relays work on transferring, gait especially turning and  pivoting, balance  Pain Assessment  Currently in Pain? No/denies      01/29/18 1110  Berg Balance Test  Sit to Stand 1  Standing Unsupported 0  Sitting with Back Unsupported but Feet Supported on Floor or Stool 3  Stand to Sit 0  Transfers 1  Standing Unsupported with Eyes Closed 0  Standing Ubsupported with Feet Together 0  From Standing, Reach Forward with Outstretched Arm 0  From Standing Position, Pick up Object from Floor 0  From Standing Position, Turn to Look Behind Over each Shoulder 0  Turn 360 Degrees 0  Standing Unsupported, Alternately Place Feet on Step/Stool 0  Standing Unsupported, One Foot in Front 0  Standing on One Leg 0  Total Score 5     01/29/18 1120  Transfers  Transfers Stand to Sit;Sit to Stand  Sit to Stand 4: Min assist;4: Min guard;With upper extremity assist;From bed;From chair/3-in-1  Sit to Stand Details (indicate cue type and reason) 3 reps at edge of mat, then 3 reps at sink. cues on hand placement and weight shifting. less assistance needed at sink.  Stand to Sit 4: Min guard;4: Min assist;With upper extremity assist;To bed;To chair/3-in-1  Stand to Sit Details x 3 reps at edge of mat, 3 reps at sink. less assistance needed at sink with cues for hand placement for safety.   Stand Pivot Transfers 4: Min assist;3: Mod assist  Stand Pivot Transfer Details (indicate cue type and reason) without AD from wheelchair to/from mat table. cues on posture and technique. less assistance  needed with transfer back to wheelchair after activity at edge of mat.  Comments with 3 stands next to mat table: cues/faciliation for hip extension, trunk extension for improved posture, ant weight shifiting due to posterior lean.   Neuro Re-ed   Neuro Re-ed Details  seated at edge of mat table without back support: worked on ant/post weight shifitng, tall posture with back extension. then with single UE support to back by PTA pt performed long arc quads, then marching x 10 reps  each leg. standing at sink: worked on UE raises to tap cabinets, moving cones left<>right across sink. min guard to min assist for standing balance, cues on posture and weight shifting.        PT Short Term Goals - 01/21/18 1336      PT SHORT TERM GOAL #1   Title  Pt will perform HEP consistently with daughter 3x/week.  02/16/18    Status  Revised      PT SHORT TERM GOAL #2   Title  Pt will peform TUG and BERG to further assess balance and will set goal for these accordingly. 4 weeks 02/16/18    Baseline  TUG completed, still assessing BERG    Status  On-going        PT Long Term Goals - 01/21/18 1337      PT LONG TERM GOAL #1   Title  Pt will increase 5TSTS with UE assistance,  time to less than 25 sec to show improved balance and strength. 8 weeks 03/13/18    Baseline  30 seconds with UE on rollator    Status  Revised      PT LONG TERM GOAL #2   Title  Pt will be able to ambulate at least 50 ft with Min A and rollator/RW for household ambulation. 8 weeks 03/13/18    Baseline  mod A, 10 feet    Status  Revised      PT LONG TERM GOAL #3   Title  Pt will be able to perform transfers(sit to stand, stand pivot, and bed mobility) with min assist or better to decrease caregiver burden. 8 weeks 03/13/18    Baseline  min-mod A with rollator    Status  Revised      PT LONG TERM GOAL #4   Title  Pt will improve BERG score by 4 points demonstrating decreased reliance on UE    Baseline  TBD    Status  New    Target Date  03/13/18      PT LONG TERM GOAL #5   Title  Pt will improve TUG with rollator by 30 seconds demonstrating improved balance and safety    Baseline  3.09 minutes    Status  New    Target Date  03/13/18          01/29/18 1110  Plan  Clinical Impression Statement Today's skilled session continued to focus on tranfer training and standing balance/tolerance. The pt was able to engage more today than with previous session. The pt should benefit from continued PT to  progress toward unmet goals.   Pt will benefit from skilled therapeutic intervention in order to improve on the following deficits Abnormal gait;Decreased activity tolerance;Decreased endurance;Decreased balance;Decreased knowledge of precautions;Decreased safety awareness;Decreased range of motion;Decreased strength;Difficulty walking;Postural dysfunction;Pain  Rehab Potential Fair  Clinical Impairments Affecting Rehab Potential cognitive impairments, dementia  PT Frequency 2x / week  PT Duration 8 weeks  PT Treatment/Interventions ADLs/Self Care Home Management;Moist Heat;Gait training;Stair training;Functional  mobility training;Therapeutic activities;Therapeutic exercise;Balance training;Neuromuscular re-education;Cognitive remediation;Patient/family education;Wheelchair mobility training;Manual techniques;Passive range of motion;Energy conservation  PT Next Visit Plan continue with standing at sink working on posture, weight shifting, standing tolerance;  Transfer training, gait training with rollator.  Changes to w/c?  what did neurologist say?  PT Home Exercise Plan [facility]  Consulted and Agree with Plan of Care Patient;Family member/caregiver  Family Member Consulted daughter         Patient will benefit from skilled therapeutic intervention in order to improve the following deficits and impairments:  Abnormal gait, Decreased activity tolerance, Decreased endurance, Decreased balance, Decreased knowledge of precautions, Decreased safety awareness, Decreased range of motion, Decreased strength, Difficulty walking, Postural dysfunction, Pain  Visit Diagnosis: Other abnormalities of gait and mobility  Repeated falls  Muscle weakness (generalized)     Problem List Patient Active Problem List   Diagnosis Date Noted  . Hallucinations 12/09/2017  . Weight loss, unintentional 09/09/2017  . Gait abnormality 04/04/2017  . Mixed hyperlipidemia 03/21/2017  . Polycystic kidney disease  03/20/2017  . Essential hypertension 03/20/2017  . Asymptomatic postmenopausal estrogen deficiency 03/20/2017  . Bilateral hearing loss 03/20/2017  . Depression, major, single episode, complete remission (HCC) 03/20/2017  . Memory deficit 03/20/2017  . H/O unilateral nephrectomy 03/20/2017  . S/P partial colectomy 03/20/2017  . Chronic cystitis 06/27/2013  . Congenital polycystic kidney 07/07/2012  . Acquired absence of kidney 07/07/2012    Sallyanne Kuster, PTA, [facility] Neuro [facility] [address], [address] [place], [place] [address] [phone] 02/01/18, 3:49 PM   Name: Tricia Daniel MRN: [id number] Date of Birth: 1940/04/18

## 2018-02-03 ENCOUNTER — Ambulatory Visit: Payer: Medicare Other | Admitting: Neurology

## 2018-02-03 ENCOUNTER — Ambulatory Visit: Payer: Medicare Other | Admitting: Physical Therapy

## 2018-02-03 ENCOUNTER — Encounter: Payer: Self-pay | Admitting: Neurology

## 2018-02-03 VITALS — BP 110/74 | HR 78

## 2018-02-03 DIAGNOSIS — R2689 Other abnormalities of gait and mobility: Secondary | ICD-10-CM

## 2018-02-03 DIAGNOSIS — R059 Cough, unspecified: Secondary | ICD-10-CM

## 2018-02-03 DIAGNOSIS — R413 Other amnesia: Secondary | ICD-10-CM | POA: Diagnosis not present

## 2018-02-03 DIAGNOSIS — M6281 Muscle weakness (generalized): Secondary | ICD-10-CM

## 2018-02-03 DIAGNOSIS — R296 Repeated falls: Secondary | ICD-10-CM

## 2018-02-03 DIAGNOSIS — R410 Disorientation, unspecified: Secondary | ICD-10-CM | POA: Diagnosis not present

## 2018-02-03 DIAGNOSIS — R443 Hallucinations, unspecified: Secondary | ICD-10-CM

## 2018-02-03 DIAGNOSIS — Z5181 Encounter for therapeutic drug level monitoring: Secondary | ICD-10-CM | POA: Diagnosis not present

## 2018-02-03 DIAGNOSIS — R05 Cough: Secondary | ICD-10-CM | POA: Diagnosis not present

## 2018-02-03 DIAGNOSIS — R269 Unspecified abnormalities of gait and mobility: Secondary | ICD-10-CM

## 2018-02-03 MED ORDER — QUETIAPINE FUMARATE 25 MG PO TABS: 25.0000 mg | ORAL_TABLET | Freq: Every day | ORAL | 2 refills | Status: AC

## 2018-02-03 NOTE — Therapy (Signed)
[facility] [address] [place], [place], [address] Phone: [phone]   Fax:  [phone]  Physical Therapy Treatment  Patient Details  Name: Tricia Daniel MRN: [id number] Date of Birth: [birth year] Referring Provider (PT): Willis,Charles MD   Encounter Date: 02/03/2018  PT End of Session - 02/03/18 1324    Visit Number  5    Number of Visits  16    Date for PT Re-Evaluation  03/13/18    Authorization Type  BCBS MCR - 10th visit PN    PT Start Time  1145    PT Stop Time  1223    PT Time Calculation (min)  38 min    Equipment Utilized During Treatment  Gait belt    Activity Tolerance  Patient limited by fatigue    Behavior During Therapy  [facility] for tasks assessed/performed;Flat affect       Past Medical History:  Diagnosis Date  . Allergy   . Arthritis   . Chronic kidney disease   . Dementia (HCC)   . Depression   . Gait abnormality 04/04/2017  . Hyperlipidemia   . Hypertension     Past Surgical History:  Procedure Laterality Date  . COLOSTOMY  1988  . COLOSTOMY REVERSAL  1999  . nephrectomy Left 1988  . TONSILLECTOMY      There were no vitals filed for this visit.  Subjective Assessment - 02/03/18 1316    Subjective  Her daughter reports they saw neuro this morning who wants to change up her meds and has ordered new CT scan    Patient is accompained by:  Family member    Pertinent History  memory disturbance, hallucinations, incontinence,CKD,Dementia,    Limitations  Standing;Walking;House hold activities    Currently in Pain?  No/denies                       [facility] Adult PT Treatment/Exercise - 02/03/18 0001      Transfers   Transfers  Stand to Sit;Sit to Stand    Sit to Stand  2: Max assist    Sit to Stand Details  Manual facilitation for weight shifting;Manual facilitation for placement    Sit to Stand Details (indicate cue type and reason)  3 reps with RW, 3 reps with PT,  continues to push back posterioly    Stand to Sit  4: Min assist;With upper extremity assist    Stand to Sit Details  6 total    Stand Pivot Transfers  2: Max assist    Stand Pivot Transfer Details (indicate cue type and reason)  without AD, pt fearful and unsteady, shuffles feet and pushes back posteriorly      Ambulation/Gait   Ambulation/Gait  Yes    Ambulation/Gait Assistance  2: Max assist    Ambulation/Gait Assistance Details  without AD, pt fearful and unsteady, shuffles feet and pushes back posteriorly    Ambulation Distance (Feet)  5 Feet   X2     Posture/Postural Control   Posture Comments  worked on sitting posture upright without back support during therex       Exercises   Other Exercises   sitting in chair LAQ and marches and heel/toe raises and hip abduction against manual resistance 2X 10 each, sitting shoulder rows, flexion, abd  X10 ea,, attempted standing marches with RW but would not perform due to fear and anxitety.  PT Short Term Goals - 01/21/18 1336      PT SHORT TERM GOAL #1   Title  Pt will perform HEP consistently with daughter 3x/week.  02/16/18    Status  Revised      PT SHORT TERM GOAL #2   Title  Pt will peform TUG and BERG to further assess balance and will set goal for these accordingly. 4 weeks 02/16/18    Baseline  TUG completed, still assessing BERG    Status  On-going        PT Long Term Goals - 01/21/18 1337      PT LONG TERM GOAL #1   Title  Pt will increase 5TSTS with UE assistance,  time to less than 25 sec to show improved balance and strength. 8 weeks 03/13/18    Baseline  30 seconds with UE on rollator    Status  Revised      PT LONG TERM GOAL #2   Title  Pt will be able to ambulate at least 50 ft with Min A and rollator/RW for household ambulation. 8 weeks 03/13/18    Baseline  mod A, 10 feet    Status  Revised      PT LONG TERM GOAL #3   Title  Pt will be able to perform transfers(sit to stand, stand pivot,  and bed mobility) with min assist or better to decrease caregiver burden. 8 weeks 03/13/18    Baseline  min-mod A with rollator    Status  Revised      PT LONG TERM GOAL #4   Title  Pt will improve BERG score by 4 points demonstrating decreased reliance on UE    Baseline  TBD    Status  New    Target Date  03/13/18      PT LONG TERM GOAL #5   Title  Pt will improve TUG with rollator by 30 seconds demonstrating improved balance and safety    Baseline  3.09 minutes    Status  New    Target Date  03/13/18            Plan - 02/03/18 1324    Clinical Impression Statement  Pt still not at baseline for ambulation and now needs max A for sit to stands, stand pivot transfers, and gait for 5 feet as she is fearful, shuffles her feet, will not shift weight anteriorly and instead pushes back posteriorly. Pt not able to use RW and was only able to ambulate holding onto PT with max A . She was however able to progress her sitting therex for posture and LE strength, and shoulder ROM. PT will continue to work on anterior weight shifting needed for sit to stands, transfers and gait.     Rehab Potential  Fair    Clinical Impairments Affecting Rehab Potential  cognitive impairments, dementia    PT Frequency  2x / week    PT Duration  8 weeks    PT Treatment/Interventions  ADLs/Self Care Home Management;Moist Heat;Gait training;Stair training;Functional mobility training;Therapeutic activities;Therapeutic exercise;Balance training;Neuromuscular re-education;Cognitive remediation;Patient/family education;Wheelchair mobility training;Manual techniques;Passive range of motion;Energy conservation    PT Next Visit Plan  continue with standing at sink working on posture, weight shifting, standing tolerance;  Transfer training, gait training with rollator.  Changes to w/c?      PT Home Exercise Plan  [id number]    Consulted and Agree with Plan of Care  Patient;Family member/caregiver    Family Member Consulted   daughter  Patient will benefit from skilled therapeutic intervention in order to improve the following deficits and impairments:  Abnormal gait, Decreased activity tolerance, Decreased endurance, Decreased balance, Decreased knowledge of precautions, Decreased safety awareness, Decreased range of motion, Decreased strength, Difficulty walking, Postural dysfunction, Pain  Visit Diagnosis: Other abnormalities of gait and mobility  Repeated falls  Muscle weakness (generalized)     Problem List Patient Active Problem List   Diagnosis Date Noted  . Hallucinations 12/09/2017  . Weight loss, unintentional 09/09/2017  . Gait abnormality 04/04/2017  . Mixed hyperlipidemia 03/21/2017  . Polycystic kidney disease 03/20/2017  . Essential hypertension 03/20/2017  . Asymptomatic postmenopausal estrogen deficiency 03/20/2017  . Bilateral hearing loss 03/20/2017  . Depression, major, single episode, complete remission (HCC) 03/20/2017  . Memory deficit 03/20/2017  . H/O unilateral nephrectomy 03/20/2017  . S/P partial colectomy 03/20/2017  . Chronic cystitis 06/27/2013  . Congenital polycystic kidney 07/07/2012  . Acquired absence of kidney 07/07/2012    Birdie RiddleBrian R Lashonta Pilling,PT,DPT 02/03/2018, 1:29 PM  [facility] [address] [place], [place], [address] Phone: [phone]   Fax:  [phone]  Name: Halford DecampMiriam Reininger MRN: [id number] Date of Birth: 08/29/1940

## 2018-02-03 NOTE — Progress Notes (Signed)
Reason for visit: Dementia, gait disorder  Tricia Daniel is an 78 y.o. female  History of present illness:  Ms. Tricia Daniel is a 78 year old right-handed white female with a history of a progressive dementia and an associated gait disorder.  The patient has had a change in her clinical condition over the last several weeks.  The patient was placed on Risperdal taking the 0.5 mg tablets, 1 in the morning and 2 in the evening.  She has started to have a change in her ability to ambulate, she is now nonambulatory, with a tendency to lean backwards.  She is drooling and somewhat tremulous.  She has been cut back some on the Risperdal taking only 2 tablets at night and not in the morning, her drowsiness during the day has improved.  She requires a wheelchair to get around at this point, she cannot walk with a walker.  The patient did have night sweats 2 days ago, she has not had any definite fevers.  Urinalysis done on 15 January 2018 did not show evidence of a bladder infection.  The patient returns to this office for an evaluation.  The patient is drooling some.  Past Medical History:  Diagnosis Date  . Allergy   . Arthritis   . Chronic kidney disease   . Dementia (HCC)   . Depression   . Gait abnormality 04/04/2017  . Hyperlipidemia   . Hypertension     Past Surgical History:  Procedure Laterality Date  . COLOSTOMY  1988  . COLOSTOMY REVERSAL  1999  . nephrectomy Left 1988  . TONSILLECTOMY      Family History  Problem Relation Age of Onset  . Arthritis Mother   . Hearing loss Mother   . Heart attack Father   . Heart disease Father   . Heart Problems Father   . Arthritis Brother   . Arthritis Daughter   . Asthma Daughter   . Depression Daughter   . Heart disease Daughter   . Hypertension Daughter   . Diabetes Maternal Grandmother   . Heart attack Maternal Grandmother   . Arthritis Daughter   . Depression Daughter   . Hyperlipidemia Daughter     Social history:  reports  that she has never smoked. She has never used smokeless tobacco. She reports that she does not drink alcohol or use drugs.    Allergies  Allergen Reactions  . Latex Rash    If leave it on too long    Medications:  Prior to Admission medications   Medication Sig Start Date End Date Taking? Authorizing Provider  amLODipine (NORVASC) 10 MG tablet Take 0.5 tablets (5 mg total) by mouth daily. 01/15/18  Yes Nche, Bonna Gains, NP  Calcium Acetate, Phos Binder, (CALCIUM ACETATE PO) Take 600 mg by mouth 2 (two) times daily.   Yes [provider]  carbamide peroxide (DEBROX) 6.5 % OTIC solution Place 5 drops into both ears 2 (two) times daily. 03/20/17  Yes Nche, Bonna Gains, NP  Cholecalciferol (VITAMIN D PO) Take 800 Units by mouth daily.   Yes [provider]  clotrimazole-betamethasone (LOTRISONE) cream Apply 1 application topically 2 (two) times daily. 03/20/17  Yes Nche, Bonna Gains, NP  diclofenac sodium (VOLTAREN) 1 % GEL Apply topically. 01/06/15  Yes [provider]  donepezil (ARICEPT) 5 MG tablet Take 1 tablet (5 mg total) by mouth at bedtime. 01/19/18  Yes York Spaniel, MD  etanercept (ENBREL SURECLICK) 50 MG/ML injection Inject into the skin.  08/29/16  Yes [provider]  FLUoxetine (PROZAC) 10 MG capsule TAKE ONE CAPSULE BY MOUTH daily. Takes total of 50mg  once a day 09/10/17  Yes Nche, Bonna Gainsharlotte Lum, NP  FLUoxetine (PROZAC) 40 MG capsule Take 1 capsule (40 mg total) by mouth daily. Takes total of 50mg  daily 09/10/17  Yes Nche, Bonna Gainsharlotte Lum, NP  folic acid (FOLVITE) 1 MG tablet Take 1 mg by mouth daily. 03/08/17  Yes [provider]  furosemide (LASIX) 20 MG tablet Take 1 tablet (20 mg total) by mouth daily. Needs office visit for additional refills 09/10/17  Yes Nche, Bonna Gainsharlotte Lum, NP  ipratropium (ATROVENT) 0.03 % nasal spray Place 1 spray into both nostrils 2 (two) times daily. Do not use for more than 5days. 12/09/17  Yes Nche, Bonna Gainsharlotte  Lum, NP  lactulose (CHRONULAC) 10 GM/15ML solution Take 15 mLs (10 g total) by mouth 2 (two) times daily as needed for moderate constipation. 01/15/18  Yes Nche, Bonna Gainsharlotte Lum, NP  methotrexate (RHEUMATREX) 2.5 MG tablet Take 12.5 mg by mouth once a week. 5 tablets every Thursday   Yes [provider]  miconazole (MICOTIN) 2 % powder Apply topically 2 (two) times daily as needed for itching. 03/20/17  Yes Nche, Bonna Gainsharlotte Lum, NP  potassium chloride SA (K-DUR,KLOR-CON) 20 MEQ tablet Take 1 tablet (20 mEq total) by mouth daily. 09/10/17  Yes Nche, Bonna Gainsharlotte Lum, NP  predniSONE (DELTASONE) 5 MG tablet TAKE ONE-HALF TO ONE tablet daily FOR 30 DAYS 03/07/17  Yes [provider]  risperiDONE (RISPERDAL) 0.5 MG tablet 1 tablet in the morning, 2 in the evening Patient taking differently: 2 in the evening 12/19/17  Yes York SpanielWillis, Charles K, MD  simvastatin (ZOCOR) 20 MG tablet TAKE ONE TABLET BY MOUTH nighly 01/05/18  Yes Nche, Bonna Gainsharlotte Lum, NP    ROS:  Out of a complete 14 system review of symptoms, the patient complains only of the following symptoms, and all other reviewed systems are negative.  Gait disorder Confusion, hallucinations  Blood pressure 110/74, pulse 78.  Physical Exam  General: The patient is alert and cooperative at the time of the examination.  Skin: No significant peripheral edema is noted.   Neurologic Exam  Mental status: The patient is alert and oriented x 2 at the time of the examination (not oriented to date).   Cranial nerves: Facial symmetry is present. Speech is normal, no aphasia or dysarthria is noted. Extraocular movements are full. Visual fields are full.  A jaw tremor is seen.  Motor: The patient has good strength in all 4 extremities.  Sensory examination: Soft touch sensation is symmetric on the face, arms, and legs.  Coordination: The patient has good finger-nose-finger bilaterally.  The patient has some apraxia with use of the lower  extremities, unable to perform heel-to-shin on either side.  The patient is somewhat tremulous on all 4 extremities.  Gait and station: The patient requires assistance with standing, once up, she has a tendency to lean backwards, cannot effectively walk.  Reflexes: Deep tendon reflexes are symmetric.   Assessment/Plan:  1.  Progressive dementia  2.  Gait disorder  The patient has had a significant change in her underlying functional level over the last several weeks.  This came on gradually, not suddenly.  This may be a medication side effect from the Risperdal with onset of parkinsonian features.  The patient will stop the Risperdal, she will be placed on Seroquel 25 mg at night.  A prescription was sent in.  Blood  work will be done today, a CT scan of the brain will be done and a chest x-ray will be done.  Marlan Palau MD 02/03/2018 9:25 AM  [facility] [address] [place], [place] [address]  Phone [phone] Fax [phone]

## 2018-02-03 NOTE — Patient Instructions (Signed)
Stop the risperidol. We will start Seroquel 25 mg at night.

## 2018-02-04 ENCOUNTER — Telehealth: Payer: Self-pay | Admitting: Neurology

## 2018-02-04 DIAGNOSIS — G2111 Neuroleptic induced parkinsonism: Secondary | ICD-10-CM

## 2018-02-04 DIAGNOSIS — R269 Unspecified abnormalities of gait and mobility: Secondary | ICD-10-CM

## 2018-02-04 LAB — COMPREHENSIVE METABOLIC PANEL
ALT: 12 IU/L (ref 0–32)
AST: 23 IU/L (ref 0–40)
Albumin/Globulin Ratio: 1.4 (ref 1.2–2.2)
Albumin: 3.8 g/dL (ref 3.7–4.7)
Alkaline Phosphatase: 67 IU/L (ref 39–117)
BUN/Creatinine Ratio: 34 — ABNORMAL HIGH (ref 12–28)
BUN: 28 mg/dL — ABNORMAL HIGH (ref 8–27)
Bilirubin Total: 0.5 mg/dL (ref 0.0–1.2)
CO2: 25 mmol/L (ref 20–29)
Calcium: 8.7 mg/dL (ref 8.7–10.3)
Chloride: 99 mmol/L (ref 96–106)
Creatinine, Ser: 0.82 mg/dL (ref 0.57–1.00)
GFR calc Af Amer: 79 mL/min/{1.73_m2} (ref >59)
GFR calc non Af Amer: 69 mL/min/{1.73_m2} (ref >59)
Globulin, Total: 2.7 g/dL (ref 1.5–4.5)
Glucose: 93 mg/dL (ref 65–99)
Potassium: 3.4 mmol/L — ABNORMAL LOW (ref 3.5–5.2)
Sodium: 145 mmol/L — ABNORMAL HIGH (ref 134–144)
Total Protein: 6.5 g/dL (ref 6.0–8.5)

## 2018-02-04 LAB — CBC WITH DIFFERENTIAL/PLATELET
Basophils Absolute: 0 10*3/uL (ref 0.0–0.2)
Basos: 0 %
EOS (ABSOLUTE): 0.3 10*3/uL (ref 0.0–0.4)
Eos: 3 %
Hematocrit: 40.9 % (ref 34.0–46.6)
Hemoglobin: 12.8 g/dL (ref 11.1–15.9)
IMMATURE GRANULOCYTES: 1 %
Immature Grans (Abs): 0.1 10*3/uL (ref 0.0–0.1)
LYMPHS: 11 %
Lymphocytes Absolute: 1.2 10*3/uL (ref 0.7–3.1)
MCH: 29.2 pg (ref 26.6–33.0)
MCHC: 31.3 g/dL — ABNORMAL LOW (ref 31.5–35.7)
MCV: 93 fL (ref 79–97)
Monocytes Absolute: 0.7 10*3/uL (ref 0.1–0.9)
Monocytes: 7 %
Neutrophils Absolute: 8.2 10*3/uL — ABNORMAL HIGH (ref 1.4–7.0)
Neutrophils: 78 %
Platelets: 203 10*3/uL (ref 150–450)
RBC: 4.38 x10E6/uL (ref 3.77–5.28)
RDW: 15.2 % (ref 11.7–15.4)
WBC: 10.5 10*3/uL (ref 3.4–10.8)

## 2018-02-04 LAB — AMMONIA: Ammonia: 52 ug/dL (ref 19–87)

## 2018-02-04 NOTE — Telephone Encounter (Signed)
I called the daughter.  The patient needs a light weight smaller wheelchair than what she has now, I will go ahead and write the prescription.  The daughter will come by and pick up the prescription.

## 2018-02-04 NOTE — Telephone Encounter (Signed)
Patient [place] asking if Dr. Anne HahnWillis put in a new order for a wheel chair because the wheel chair she has its it hard for her.

## 2018-02-04 NOTE — Addendum Note (Signed)
Addended by: York Spaniel on: 02/04/2018 01:58 PM   Modules accepted: Orders

## 2018-02-04 NOTE — Telephone Encounter (Signed)
[employer] Berkley Harvey: [id number] (exp. 02/04/18 to 03/05/18)  order sent to GI pt daughter Rosey Bath on dpr is aware EE

## 2018-02-06 ENCOUNTER — Ambulatory Visit: Payer: Medicare Other | Admitting: Physical Therapy

## 2018-02-06 ENCOUNTER — Encounter (HOSPITAL_COMMUNITY): Payer: Self-pay | Admitting: Emergency Medicine

## 2018-02-06 ENCOUNTER — Other Ambulatory Visit: Payer: Self-pay

## 2018-02-06 ENCOUNTER — Emergency Department (HOSPITAL_COMMUNITY): Payer: Medicare Other

## 2018-02-06 ENCOUNTER — Encounter: Payer: Self-pay | Admitting: Nurse Practitioner

## 2018-02-06 ENCOUNTER — Emergency Department (HOSPITAL_COMMUNITY)
Admission: EM | Admit: 2018-02-06 | Discharge: 2018-02-06 | Disposition: A | Discharge status: Home / Self Care | Payer: Medicare Other | Attending: Emergency Medicine | Admitting: Emergency Medicine

## 2018-02-06 DIAGNOSIS — Z79899 Other long term (current) drug therapy: Secondary | ICD-10-CM | POA: Diagnosis not present

## 2018-02-06 DIAGNOSIS — N390 Urinary tract infection, site not specified: Secondary | ICD-10-CM | POA: Diagnosis not present

## 2018-02-06 DIAGNOSIS — R4789 Other speech disturbances: Secondary | ICD-10-CM | POA: Diagnosis not present

## 2018-02-06 DIAGNOSIS — R404 Transient alteration of awareness: Secondary | ICD-10-CM | POA: Diagnosis not present

## 2018-02-06 DIAGNOSIS — E785 Hyperlipidemia, unspecified: Secondary | ICD-10-CM | POA: Diagnosis not present

## 2018-02-06 DIAGNOSIS — I10 Essential (primary) hypertension: Secondary | ICD-10-CM | POA: Diagnosis not present

## 2018-02-06 DIAGNOSIS — R4182 Altered mental status, unspecified: Secondary | ICD-10-CM

## 2018-02-06 DIAGNOSIS — F0390 Unspecified dementia without behavioral disturbance: Secondary | ICD-10-CM | POA: Diagnosis not present

## 2018-02-06 DIAGNOSIS — R531 Weakness: Secondary | ICD-10-CM

## 2018-02-06 DIAGNOSIS — R319 Hematuria, unspecified: Secondary | ICD-10-CM

## 2018-02-06 DIAGNOSIS — Z91040 Latex allergy status: Secondary | ICD-10-CM | POA: Diagnosis not present

## 2018-02-06 DIAGNOSIS — R29810 Facial weakness: Secondary | ICD-10-CM | POA: Diagnosis present

## 2018-02-06 LAB — URINALYSIS, ROUTINE W REFLEX MICROSCOPIC
Bilirubin Urine: NEGATIVE
Glucose, UA: NEGATIVE mg/dL
Ketones, ur: NEGATIVE mg/dL
Nitrite: NEGATIVE
Protein, ur: 30 mg/dL — AB
SPECIFIC GRAVITY, URINE: 1.025 (ref 1.005–1.030)
pH: 5.5 (ref 5.0–8.0)

## 2018-02-06 LAB — URINALYSIS, MICROSCOPIC (REFLEX)
Squamous Epithelial / HPF: NONE SEEN (ref 0–5)
WBC, UA: >50 WBC/hpf (ref 0–5)

## 2018-02-06 LAB — RAPID URINE DRUG SCREEN, HOSP PERFORMED
Amphetamines: NOT DETECTED
Barbiturates: NOT DETECTED
Benzodiazepines: NOT DETECTED
Cocaine: NOT DETECTED
Opiates: NOT DETECTED
Tetrahydrocannabinol: NOT DETECTED

## 2018-02-06 LAB — DIFFERENTIAL
Abs Immature Granulocytes: 0.11 10*3/uL — ABNORMAL HIGH (ref 0.00–0.07)
Basophils Absolute: 0 10*3/uL (ref 0.0–0.1)
Basophils Relative: 0 %
EOS PCT: 2 %
Eosinophils Absolute: 0.4 10*3/uL (ref 0.0–0.5)
Immature Granulocytes: 1 %
Lymphocytes Relative: 8 %
Lymphs Abs: 1.2 10*3/uL (ref 0.7–4.0)
Monocytes Absolute: 1.5 10*3/uL — ABNORMAL HIGH (ref 0.1–1.0)
Monocytes Relative: 9 %
Neutro Abs: 13.3 10*3/uL — ABNORMAL HIGH (ref 1.7–7.7)
Neutrophils Relative %: 80 %

## 2018-02-06 LAB — CBC
HCT: 45.4 % (ref 36.0–46.0)
Hemoglobin: 13.4 g/dL (ref 12.0–15.0)
MCH: 28.9 pg (ref 26.0–34.0)
MCHC: 29.5 g/dL — ABNORMAL LOW (ref 30.0–36.0)
MCV: 97.8 fL (ref 80.0–100.0)
Platelets: 213 10*3/uL (ref 150–400)
RBC: 4.64 MIL/uL (ref 3.87–5.11)
RDW: 16.3 % — AB (ref 11.5–15.5)
WBC: 16.4 10*3/uL — ABNORMAL HIGH (ref 4.0–10.5)
nRBC: 0 % (ref 0.0–0.2)

## 2018-02-06 LAB — COMPREHENSIVE METABOLIC PANEL
ALT: 16 U/L (ref 0–44)
ANION GAP: 14 (ref 5–15)
AST: 29 U/L (ref 15–41)
Albumin: 3.2 g/dL — ABNORMAL LOW (ref 3.5–5.0)
Alkaline Phosphatase: 64 U/L (ref 38–126)
BUN: 33 mg/dL — ABNORMAL HIGH (ref 8–23)
CO2: 26 mmol/L (ref 22–32)
Calcium: 9.2 mg/dL (ref 8.9–10.3)
Chloride: 101 mmol/L (ref 98–111)
Creatinine, Ser: 1.08 mg/dL — ABNORMAL HIGH (ref 0.44–1.00)
GFR calc Af Amer: 57 mL/min — ABNORMAL LOW (ref >60)
GFR calc non Af Amer: 49 mL/min — ABNORMAL LOW (ref >60)
GLUCOSE: 180 mg/dL — AB (ref 70–99)
Potassium: 3.1 mmol/L — ABNORMAL LOW (ref 3.5–5.1)
Sodium: 141 mmol/L (ref 135–145)
Total Bilirubin: 0.7 mg/dL (ref 0.3–1.2)
Total Protein: 7.1 g/dL (ref 6.5–8.1)

## 2018-02-06 LAB — APTT: aPTT: 27 seconds (ref 24–36)

## 2018-02-06 LAB — PROTIME-INR
INR: 1.13
PROTHROMBIN TIME: 14.4 s (ref 11.4–15.2)

## 2018-02-06 LAB — I-STAT TROPONIN, ED: Troponin i, poc: 0 ng/mL (ref 0.00–0.08)

## 2018-02-06 LAB — ETHANOL: Alcohol, Ethyl (B): <10 mg/dL (ref <10)

## 2018-02-06 LAB — LACTIC ACID, PLASMA: Lactic Acid, Venous: 1.3 mmol/L (ref 0.5–1.9)

## 2018-02-06 MED ORDER — POTASSIUM CHLORIDE ER 10 MEQ PO TBCR: 40.0000 meq | EXTENDED_RELEASE_TABLET | Freq: Every day | ORAL | 0 refills | Status: DC

## 2018-02-06 MED ORDER — SODIUM CHLORIDE 0.9 % IV BOLUS
500.0000 mL | Freq: Once | INTRAVENOUS | Status: AC
  Administered 2018-02-06: 500 mL via INTRAVENOUS

## 2018-02-06 MED ORDER — SODIUM CHLORIDE 0.9 % IV SOLN
1.0000 g | Freq: Once | INTRAVENOUS | Status: AC
  Administered 2018-02-06: 1 g via INTRAVENOUS
  Filled 2018-02-06: qty 10

## 2018-02-06 MED ORDER — SODIUM CHLORIDE 0.9 % IV BOLUS: 500.0000 mL | Freq: Once | INTRAVENOUS | Status: DC

## 2018-02-06 MED ORDER — CEPHALEXIN 500 MG PO CAPS: 500.0000 mg | ORAL_CAPSULE | Freq: Two times a day (BID) | ORAL | 0 refills | Status: DC

## 2018-02-06 MED ORDER — POTASSIUM CHLORIDE CRYS ER 20 MEQ PO TBCR: 40.0000 meq | EXTENDED_RELEASE_TABLET | Freq: Once | ORAL | Status: DC

## 2018-02-06 NOTE — Consult Note (Signed)
Medical Consultation   Moira Umholtz  ZOX:[id number]  DOB: [birth year]  DOA: 02/06/2018  PCP: Anne Ng, NP   Outpatient Specialists: Dr Anne Hahn ,neurology   Requesting physician:ED physician  Reason for consultation: Altered mental status possible UTI    History of Present Illness: Shanoah Asbill is an 78 y.o. female with past medical history of dementia, hypertension, depression, arthritis, hyperlipidemia who was brought by the family from home for the evaluation of altered mental status, slurred speech, facial droop.  All the information was taken from the daughter on the bedside.  Patient was apparently all right till yesterday.  Since yesterday she became confused.  She started drooling saliva, had slurred speech and also developed facial droop.  Family were concerned about a stroke and brought  the patient the emergency department. Patient underwent CT head and MRI of the brain which did not show any acute stroke.  Chest x-ray did not show any pneumonia.  UA was suggestive of urinary tract infection.  We were asked to admit. Patient seen and examined the bedside in the emergency department.  She is hemodynamically stable.  There is no report of fever or chills at home.  Currently she is alert and oriented x4.  The slurred speech and facial droop has resolved.  Patient feels comfortable and wants to go home. Upon discussion with the patient and family, there was no report of dysuria, nausea, vomiting or diarrhea.  She has dementia and follows with neurologist Dr. Anne Hahn.  She walks with the help of walker/wheelchair and follows with outpatient physical therapy.  Patient also had a urine culture done earlier this month and it did not show any growth. Patient denied any chest pain, shortness of breath, abdominal pain during my evaluation.     Review of Systems:  ROS As per HPI otherwise 10 point review of systems negative.     Past Medical History: Past  Medical History:  Diagnosis Date  . Allergy   . Arthritis   . Chronic kidney disease   . Dementia (HCC)   . Depression   . Gait abnormality 04/04/2017  . Hyperlipidemia   . Hypertension     Past Surgical History: Past Surgical History:  Procedure Laterality Date  . COLOSTOMY  1988  . COLOSTOMY REVERSAL  1999  . nephrectomy Left 1988  . TONSILLECTOMY       Allergies:   Allergies  Allergen Reactions  . Latex Rash    If leave it on too long     Social History:  reports that she has never smoked. She has never used smokeless tobacco. She reports that she does not drink alcohol or use drugs.   Family History: Family History  Problem Relation Age of Onset  . Arthritis Mother   . Hearing loss Mother   . Heart attack Father   . Heart disease Father   . Heart Problems Father   . Arthritis Brother   . Arthritis Daughter   . Asthma Daughter   . Depression Daughter   . Heart disease Daughter   . Hypertension Daughter   . Diabetes Maternal Grandmother   . Heart attack Maternal Grandmother   . Arthritis Daughter   . Depression Daughter   . Hyperlipidemia Daughter       Physical Exam: Vitals:   02/06/18 1404 02/06/18 1515 02/06/18 1530 02/06/18 1545  BP:  126/72 132/65 115/65  Pulse:  100 96  96  Resp:  20 18 17   Temp: 98.5 F (36.9 C)     TempSrc: Oral     SpO2:  98% 99% 100%  Weight:        General exam: Appears calm and comfortable ,Not in distress,average built, female HEENT:PERRL,Oral mucosa moist, Ear/Nose normal on gross exam Respiratory system: Bilateral equal air entry, normal vesicular breath sounds, no wheezes or crackles  Cardiovascular system: S1 & S2 heard, RRR. No JVD, murmurs, rubs, gallops or clicks. Gastrointestinal system: Abdomen is nondistended, soft and nontender. No organomegaly or masses felt. Normal bowel sounds heard. Central nervous system: Alert and oriented. No focal neurological deficits. Extremities: No edema, no clubbing ,no  cyanosis, distal peripheral pulses palpable. Skin: No rashes, lesions or ulcers,no icterus ,no pallor MSK: Normal muscle bulk,tone ,power   Data reviewed:  I have personally reviewed following labs and imaging studies Labs:  CBC: Recent Labs  Lab 02/03/18 0938 02/06/18 1255  WBC 10.5 16.4*  NEUTROABS 8.2* 13.3*  HGB 12.8 13.4  HCT 40.9 45.4  MCV 93 97.8  PLT 203 213    Basic Metabolic Panel: Recent Labs  Lab 02/03/18 0938 02/06/18 1255  NA 145* 141  K 3.4* 3.1*  CL 99 101  CO2 25 26  GLUCOSE 93 180*  BUN 28* 33*  CREATININE 0.82 1.08*  CALCIUM 8.7 9.2   GFR Estimated Creatinine Clearance: 34 mL/min (A) (by C-G formula based on SCr of 1.08 mg/dL (H)). Liver Function Tests: Recent Labs  Lab 02/03/18 0938 02/06/18 1255  AST 23 29  ALT 12 16  ALKPHOS 67 64  BILITOT 0.5 0.7  PROT 6.5 7.1  ALBUMIN 3.8 3.2*   No results for input(s): LIPASE, AMYLASE in the last 168 hours. Recent Labs  Lab 02/03/18 0938  AMMONIA 52   Coagulation profile Recent Labs  Lab 02/06/18 1255  INR 1.13    Cardiac Enzymes: No results for input(s): CKTOTAL, CKMB, CKMBINDEX, TROPONINI in the last 168 hours. BNP: Invalid input(s): POCBNP CBG: No results for input(s): GLUCAP in the last 168 hours. D-Dimer No results for input(s): DDIMER in the last 72 hours. Hgb A1c No results for input(s): HGBA1C in the last 72 hours. Lipid Profile No results for input(s): CHOL, HDL, LDLCALC, TRIG, CHOLHDL, LDLDIRECT in the last 72 hours. Thyroid function studies No results for input(s): TSH, T4TOTAL, T3FREE, THYROIDAB in the last 72 hours.  Invalid input(s): FREET3 Anemia work up No results for input(s): VITAMINB12, FOLATE, FERRITIN, TIBC, IRON, RETICCTPCT in the last 72 hours. Urinalysis    Component Value Date/Time   COLORURINE YELLOW 02/06/2018 1356   APPEARANCEUR CLOUDY (A) 02/06/2018 1356   LABSPEC 1.025 02/06/2018 1356   PHURINE 5.5 02/06/2018 1356   GLUCOSEU NEGATIVE 02/06/2018  1356   HGBUR LARGE (A) 02/06/2018 1356   BILIRUBINUR NEGATIVE 02/06/2018 1356   BILIRUBINUR neg 09/09/2017 1153   KETONESUR NEGATIVE 02/06/2018 1356   PROTEINUR 30 (A) 02/06/2018 1356   UROBILINOGEN 0.2 09/09/2017 1153   NITRITE NEGATIVE 02/06/2018 1356   LEUKOCYTESUR MODERATE (A) 02/06/2018 1356     Microbiology No results found for this or any previous visit (from the past 240 hour(s)).     Inpatient Medications:   Scheduled Meds: . potassium chloride  40 mEq Oral Once   Continuous Infusions:   Radiological Exams on Admission: Ct Head Wo Contrast  Result Date: 02/06/2018 CLINICAL DATA:  Onset slurred speech and left eye drooping last night at 8 p.m. EXAM: CT HEAD WITHOUT CONTRAST TECHNIQUE: Contiguous  axial images were obtained from the base of the skull through the vertex without intravenous contrast. COMPARISON:  None. FINDINGS: Brain: No evidence of acute infarction, hemorrhage, hydrocephalus, extra-axial collection or mass lesion/mass effect. Atrophy and chronic microvascular ischemic change noted. Vascular: No hyperdense vessel or unexpected calcification. Skull: Intact.  No focal lesion. Sinuses/Orbits: Negative. Other: None. IMPRESSION: No acute abnormality. Atrophy and chronic microvascular ischemic change. Electronically Signed   By: Drusilla Kanner M.D.   On: 02/06/2018 13:31   Mr Brain Wo Contrast  Result Date: 02/06/2018 CLINICAL DATA:  Slurred speech, LEFT facial droop beginning last night. History of dementia, hypertension and hyperlipidemia. EXAM: MRI HEAD WITHOUT CONTRAST TECHNIQUE: Multiplanar, multiecho pulse sequences of the brain and surrounding structures were obtained without intravenous contrast. COMPARISON:  CT HEAD February 06, 2018 and MRI head April 18, 2017. FINDINGS: Moderately motion degraded sequences after multiple attempts. INTRACRANIAL CONTENTS: No reduced diffusion to suggest acute ischemia. No susceptibility artifact to suggest hemorrhage. RIGHT  cerebellar suspected developmental venous anomaly. Patchy supratentorial white matter T2 hyperintensities compatible. No advanced parenchymal brain volume loss for age. No hydrocephalus. No suspicious parenchymal signal, masses, mass effect. No abnormal extra-axial fluid collections. No extra-axial masses. VASCULAR: Normal major intracranial vascular flow voids present at skull base. SKULL AND UPPER CERVICAL SPINE: No abnormal sellar expansion. No suspicious calvarial bone marrow signal. Craniocervical junction maintained. SINUSES/ORBITS: The mastoid air-cells and included paranasal sinuses are well-aerated.The included ocular globes and orbital contents are non-suspicious. OTHER: Patient is edentulous. IMPRESSION: 1. Moderately motion degraded examination. No acute intracranial process. 2. Moderate chronic small vessel ischemic changes. Electronically Signed   By: Awilda Metro M.D.   On: 02/06/2018 15:05   Dg Chest Portable 1 View  Result Date: 02/06/2018 CLINICAL DATA:  Episode of difficulty swallowing yesterday. Question aspiration. EXAM: PORTABLE CHEST 1 VIEW COMPARISON:  PA and lateral chest 09/09/2017. FINDINGS: Mild left basilar atelectasis or scar is unchanged. Lungs otherwise clear. Heart size normal. No pneumothorax or pleural effusion. No acute bony abnormality. Severe bilateral glenohumeral osteoarthritis noted. IMPRESSION: Negative for aspiration.  No acute disease. Electronically Signed   By: Drusilla Kanner M.D.   On: 02/06/2018 13:10    Impression/Recommendations Principal Problem:   Altered mental status  Altered mental status: Patient completely alert and oriented at present and is on baseline.  MRI did not show any acute stroke.  Her episode of altered mental status could be associated with her dementia. Continue follow-up with her neurologist as an outpatient.  Suspected UTI: No report of fever, chills or any symptoms of dysuria.  Her urine culture done earlier this month did  not show any growth.  UA done here was suggestive of  urinary tract infection though this is doubtful for true UTI.  She got a dose of ceftriaxone here.  She can be discharged with Keflex for 3 to 5 days.  Debility/deconditioning/arthritis: Follows with physical therapy as an outpatient.  Walks with the help of walker.  Also uses wheelchair as needed.  Follows with her rheumatologist for the management of her arthritis.  Leukocytosis: As per the daughter she had leukocytosis earlier this month too.  I do not suspect any septic etiology.  She is afebrile.  Check CBC in a week due to follow-up with her PCP.  Hypokalemia: Please supplement with potassium.  After extensive discussion with the family and the patient at the bedside, I do not think that patient needs to be admitted. Family agreeable on this. I discussed with the emergency physician  about this.  Patient being discharged home.    Time Spent: 55 mins.  Burnadette PopAmrit Gurinder Toral M.D. [facility] [phone] 02/06/2018, 4:29 PM

## 2018-02-06 NOTE — ED Triage Notes (Signed)
Pt in from home with stroke-like symptoms. Daughter states pt had slurred speech and L eye/droop since last night at 2000. Hx of TIA's and dementia

## 2018-02-06 NOTE — ED Provider Notes (Signed)
[facility] EMERGENCY DEPARTMENT Provider Note   CSN: [id number] Arrival date & time: 02/06/18  1227   History   Chief Complaint Chief Complaint  Patient presents with  . Stroke Symptoms    HPI Kenai Sharples is a 78 y.o. female with history of dementia presenting today for left-sided facial droop, slurred speech and difficulty swallowing that began at 8 PM on 02/05/2018.  Last seen normal approximately 16 hours ago.  History obtained from both patient and her daughter who is at bedside.  Patient with sudden onset left-sided facial droop, difficulty swallowing and slurred speech at 8 PM, symptoms have been continuous since time of onset.  On my initial evaluation patient's daughter in room states that she feels the patient's left-sided facial droop has slightly improved since this morning.  Additionally patient noted to be shaking intermittently, left leg and right arm, daughter states that this is new today.  HPI  Past Medical History:  Diagnosis Date  . Allergy   . Arthritis   . Chronic kidney disease   . Dementia (HCC)   . Depression   . Gait abnormality 04/04/2017  . Hyperlipidemia   . Hypertension     Patient Active Problem List   Diagnosis Date Noted  . Altered mental status 02/06/2018  . Hallucinations 12/09/2017  . Weight loss, unintentional 09/09/2017  . Gait abnormality 04/04/2017  . Mixed hyperlipidemia 03/21/2017  . Polycystic kidney disease 03/20/2017  . Essential hypertension 03/20/2017  . Asymptomatic postmenopausal estrogen deficiency 03/20/2017  . Bilateral hearing loss 03/20/2017  . Depression, major, single episode, complete remission (HCC) 03/20/2017  . Memory deficit 03/20/2017  . H/O unilateral nephrectomy 03/20/2017  . S/P partial colectomy 03/20/2017  . Chronic cystitis 06/27/2013  . Congenital polycystic kidney 07/07/2012  . Acquired absence of kidney 07/07/2012    Past Surgical History:  Procedure Laterality Date  .  COLOSTOMY  1988  . COLOSTOMY REVERSAL  1999  . nephrectomy Left 1988  . TONSILLECTOMY       OB History   No obstetric history on file.      Home Medications    Prior to Admission medications   Medication Sig Start Date End Date Taking? Authorizing Provider  amLODipine (NORVASC) 10 MG tablet Take 0.5 tablets (5 mg total) by mouth daily. 01/15/18  Yes Nche, Bonna Gains, NP  Calcium Carbonate-Vitamin D (CALCIUM 600+D HIGH POTENCY) 600-400 MG-UNIT tablet Take 1 tablet by mouth daily.   Yes [provider]  carbamide peroxide (DEBROX) 6.5 % OTIC solution Place 5 drops into both ears 2 (two) times daily. Patient taking differently: Place 5 drops into both ears 2 (two) times daily as needed (ear wax).  03/20/17  Yes Nche, Bonna Gains, NP  clotrimazole-betamethasone (LOTRISONE) cream Apply 1 application topically 2 (two) times daily. Patient taking differently: Apply 1 application topically 2 (two) times daily as needed (rash).  03/20/17  Yes Nche, Bonna Gains, NP  diclofenac sodium (VOLTAREN) 1 % GEL Apply 2 g topically 2 (two) times daily as needed (arthritis pain).  01/06/15  Yes [provider]  donepezil (ARICEPT) 5 MG tablet Take 1 tablet (5 mg total) by mouth at bedtime. 01/19/18  Yes York Spaniel, MD  ENSURE PLUS (ENSURE PLUS) LIQD Take 237 mLs by mouth 2 (two) times daily between meals.   Yes [provider]  etanercept (ENBREL SURECLICK) 50 MG/ML injection Inject 50 mg into the skin once a week. Sunday 08/29/16  Yes [provider]  FLUoxetine (PROZAC)  10 MG capsule TAKE ONE CAPSULE BY MOUTH daily. Takes total of  once a day 09/10/17  Yes Nche, Bonna Gains, NP  FLUoxetine (PROZAC) 40 MG capsule Take 1 capsule (40 mg total) by mouth daily. Takes total of  daily 09/10/17  Yes Nche, Bonna Gains, NP  folic acid (FOLVITE) 1 MG tablet Take 1 mg by mouth daily. 03/08/17  Yes [provider]  furosemide (LASIX) 20 MG tablet Take 1 tablet  (20 mg total) by mouth daily. Needs office visit for additional refills 09/10/17  Yes Nche, Bonna Gains, NP  ibuprofen (ADVIL,MOTRIN) 200 MG tablet Take 200-400 mg by mouth every 6 (six) hours as needed for headache or moderate pain.   Yes [provider]  ipratropium (ATROVENT) 0.03 % nasal spray Place 1 spray into both nostrils 2 (two) times daily. Do not use for more than 5days. Patient taking differently: Place 1 spray into both nostrils 2 (two) times daily as needed for rhinitis. Do not use for more than 5days. 12/09/17  Yes Nche, Bonna Gains, NP  lactulose (CHRONULAC) 10 GM/15ML solution Take 15 mLs (10 g total) by mouth 2 (two) times daily as needed for moderate constipation. 01/15/18  Yes Nche, Bonna Gains, NP  methotrexate (RHEUMATREX) 2.5 MG tablet Take 12.5 mg by mouth once a week. 5 tablets every Thursday   Yes [provider]  miconazole (MICOTIN) 2 % powder Apply topically 2 (two) times daily as needed for itching. 03/20/17  Yes Nche, Bonna Gains, NP  Multiple Vitamins-Minerals (MULTIVITAMIN WOMEN 50+ PO) Take 1 tablet by mouth daily.   Yes [provider]  potassium chloride SA (K-DUR,KLOR-CON) 20 MEQ tablet Take 1 tablet (20 mEq total) by mouth daily. 09/10/17  Yes Nche, Bonna Gains, NP  predniSONE (DELTASONE) 5 MG tablet Take 5 mg by mouth daily with breakfast.  03/07/17  Yes [provider]  QUEtiapine (SEROQUEL) 25 MG tablet Take 1 tablet (25 mg total) by mouth at bedtime. 02/03/18  Yes York Spaniel, MD  simvastatin (ZOCOR) 20 MG tablet TAKE ONE TABLET BY MOUTH nighly 01/05/18  Yes Nche, Bonna Gains, NP  cephALEXin (KEFLEX) 500 MG capsule Take 1 capsule (500 mg total) by mouth 2 (two) times daily for 5 days. 02/06/18 02/11/18  Harlene Salts A, PA-C  potassium chloride (K-DUR) 10 MEQ tablet Take 4 tablets (40 mEq total) by mouth daily for 3 days. 02/06/18 02/09/18  Bill Salinas, PA-C    Family History Family History  Problem Relation  Age of Onset  . Arthritis Mother   . Hearing loss Mother   . Heart attack Father   . Heart disease Father   . Heart Problems Father   . Arthritis Brother   . Arthritis Daughter   . Asthma Daughter   . Depression Daughter   . Heart disease Daughter   . Hypertension Daughter   . Diabetes Maternal Grandmother   . Heart attack Maternal Grandmother   . Arthritis Daughter   . Depression Daughter   . Hyperlipidemia Daughter     Social History Social History   Tobacco Use  . Smoking status: Never Smoker  . Smokeless tobacco: Never Used  Substance Use Topics  . Alcohol use: No    Frequency: Never  . Drug use: No     Allergies   Latex   Review of Systems Review of Systems  Constitutional: Negative.  Negative for chills and fever.  HENT: Positive for trouble swallowing. Negative for rhinorrhea and sore throat.  Eyes: Negative.  Negative for visual disturbance.  Respiratory: Negative.  Negative for cough and shortness of breath.   Cardiovascular: Negative.  Negative for chest pain.  Gastrointestinal: Negative.  Negative for abdominal pain, blood in stool, diarrhea, nausea and vomiting.  Genitourinary:       Incontinent at baseline  Musculoskeletal: Negative.  Negative for arthralgias and myalgias.  Neurological: Positive for facial asymmetry and speech difficulty. Negative for dizziness, weakness and headaches.  All other systems reviewed and are negative.  Physical Exam Updated Vital Signs BP 115/65   Pulse 96   Temp 98.5 F (36.9 C) (Oral)   Resp 17   Wt 56.6 kg   SpO2 100%   BMI 22.82 kg/m   Physical Exam Constitutional:      General: She is not in acute distress.    Appearance: She is well-developed.     Comments: Frail-appearing  HENT:     Head: Normocephalic and atraumatic.     Right Ear: Tympanic membrane, ear canal and external ear normal.     Left Ear: Tympanic membrane, ear canal and external ear normal.     Nose: Nose normal.     Mouth/Throat:       Mouth: Mucous membranes are moist.     Pharynx: Oropharynx is clear.  Eyes:     General: Vision grossly intact. Gaze aligned appropriately.     Extraocular Movements: Extraocular movements intact.     Conjunctiva/sclera: Conjunctivae normal.     Pupils: Pupils are equal, round, and reactive to light.  Neck:     Musculoskeletal: Full passive range of motion without pain, normal range of motion and neck supple. No spinous process tenderness or muscular tenderness.     Trachea: Trachea and phonation normal. No tracheal deviation.  Cardiovascular:     Rate and Rhythm: Normal rate and regular rhythm.     Pulses: Normal pulses.          Dorsalis pedis pulses are 2+ on the right side and 2+ on the left side.       Posterior tibial pulses are 2+ on the right side and 2+ on the left side.     Heart sounds: Normal heart sounds.  Pulmonary:     Effort: Pulmonary effort is normal. No respiratory distress.     Breath sounds: Normal breath sounds and air entry.  Chest:     Chest wall: No deformity, tenderness or crepitus.  Abdominal:     General: Bowel sounds are normal.     Palpations: Abdomen is soft.     Tenderness: There is no abdominal tenderness. There is no guarding or rebound.  Musculoskeletal: Normal range of motion.        General: No tenderness.     Right lower leg: No edema.     Left lower leg: No edema.     Comments: No midline C/T/L spinal tenderness to palpation, no paraspinal muscle tenderness, no deformity, crepitus, or step-off noted. No sign of injury to the neck or back.  Hips stable to compression bilaterally.  Passive range of motion of the hip, knee-to-chest, intact without pain.  Feet:     Right foot:     Protective Sensation: 3 sites tested. 3 sites sensed.     Left foot:     Protective Sensation: 3 sites tested. 3 sites sensed.  Skin:    General: Skin is warm and dry.     Capillary Refill: Capillary refill takes less than 2 seconds.  Neurological:  Mental  Status: She is alert and oriented to person, place, and time.     GCS: GCS eye subscore is 4. GCS verbal subscore is 5. GCS motor subscore is 6.     Comments: Mental Status: Alert, oriented, thought content appropriate, able to give a coherent history. Speech fluent without evidence of aphasia. Able to follow 2 step commands without difficulty. Cranial Nerves: II: Peripheral visual fields grossly normal, pupils equal, round, reactive to light III,IV, VI: ptosis not present, extra-ocular motions intact bilaterally V,VII: Questionable left mouth droop that family reports is improving from last night, eyebrows raise symmetric, facial light touch sensation equal VIII: hearing grossly normal to voice X: uvula elevates symmetrically XI: bilateral shoulder shrug symmetric and strong XII: midline tongue extension without fassiculations Motor: Normal tone. 5/5 strength in upper and lower extremities bilaterally including strong and equal grip strength and dorsiflexion/plantar flexion Sensory: Sensation intact to light touch in all extremities.  Cerebellar: normal finger-to-nose with bilateral upper extremities. Normal heel-to -shin balance bilaterally of the lower extremity. No pronator drift.  Gait: Patient able to ambulate with some assistance from wheelchair to bed. CV: distal pulses palpable throughout  Psychiatric:        Behavior: Behavior normal.    ED Treatments / Results  Labs (all labs ordered are listed, but only abnormal results are displayed) Labs Reviewed  CBC - Abnormal; Notable for the following components:      Result Value   WBC 16.4 (*)    MCHC 29.5 (*)    RDW 16.3 (*)    All other components within normal limits  DIFFERENTIAL - Abnormal; Notable for the following components:   Neutro Abs 13.3 (*)    Monocytes Absolute 1.5 (*)    Abs Immature Granulocytes 0.11 (*)    All other components within normal limits  COMPREHENSIVE METABOLIC PANEL - Abnormal; Notable for  the following components:   Potassium 3.1 (*)    Glucose, Bld 180 (*)    BUN 33 (*)    Creatinine, Ser 1.08 (*)    Albumin 3.2 (*)    GFR calc non Af Amer 49 (*)    GFR calc Af Amer 57 (*)    All other components within normal limits  URINALYSIS, ROUTINE W REFLEX MICROSCOPIC - Abnormal; Notable for the following components:   APPearance CLOUDY (*)    Hgb urine dipstick LARGE (*)    Protein, ur 30 (*)    Leukocytes, UA MODERATE (*)    All other components within normal limits  URINALYSIS, MICROSCOPIC (REFLEX) - Abnormal; Notable for the following components:   Bacteria, UA MANY (*)    All other components within normal limits  CULTURE, BLOOD (ROUTINE X 2)  CULTURE, BLOOD (ROUTINE X 2)  URINE CULTURE  ETHANOL  PROTIME-INR  APTT  RAPID URINE DRUG SCREEN, HOSP PERFORMED  LACTIC ACID, PLASMA  LACTIC ACID, PLASMA  I-STAT TROPONIN, ED    EKG EKG Interpretation  Date/Time:  Friday February 06 2018 12:50:33 EST Ventricular Rate:  108 PR Interval:    QRS Duration: 94 QT Interval:  316 QTC Calculation: 424 R Axis:   83 Text Interpretation:  Sinus tachycardia Atrial premature complexes Consider RVH w/ secondary repol abnormality Repol abnrm suggests ischemia, diffuse leads No old tracing to compare Confirmed by Jacalyn Lefevre [phone]) on 02/06/2018 1:09:00 PM   Radiology Ct Head Wo Contrast  Result Date: 02/06/2018 CLINICAL DATA:  Onset slurred speech and left eye drooping last night at 8 p.m. EXAM:  CT HEAD WITHOUT CONTRAST TECHNIQUE: Contiguous axial images were obtained from the base of the skull through the vertex without intravenous contrast. COMPARISON:  None. FINDINGS: Brain: No evidence of acute infarction, hemorrhage, hydrocephalus, extra-axial collection or mass lesion/mass effect. Atrophy and chronic microvascular ischemic change noted. Vascular: No hyperdense vessel or unexpected calcification. Skull: Intact.  No focal lesion. Sinuses/Orbits: Negative. Other: None.  IMPRESSION: No acute abnormality. Atrophy and chronic microvascular ischemic change. Electronically Signed   By: Drusilla Kannerhomas  Dalessio M.D.   On: 02/06/2018 13:31   Mr Brain Wo Contrast  Result Date: 02/06/2018 CLINICAL DATA:  Slurred speech, LEFT facial droop beginning last night. History of dementia, hypertension and hyperlipidemia. EXAM: MRI HEAD WITHOUT CONTRAST TECHNIQUE: Multiplanar, multiecho pulse sequences of the brain and surrounding structures were obtained without intravenous contrast. COMPARISON:  CT HEAD February 06, 2018 and MRI head April 18, 2017. FINDINGS: Moderately motion degraded sequences after multiple attempts. INTRACRANIAL CONTENTS: No reduced diffusion to suggest acute ischemia. No susceptibility artifact to suggest hemorrhage. RIGHT cerebellar suspected developmental venous anomaly. Patchy supratentorial white matter T2 hyperintensities compatible. No advanced parenchymal brain volume loss for age. No hydrocephalus. No suspicious parenchymal signal, masses, mass effect. No abnormal extra-axial fluid collections. No extra-axial masses. VASCULAR: Normal major intracranial vascular flow voids present at skull base. SKULL AND UPPER CERVICAL SPINE: No abnormal sellar expansion. No suspicious calvarial bone marrow signal. Craniocervical junction maintained. SINUSES/ORBITS: The mastoid air-cells and included paranasal sinuses are well-aerated.The included ocular globes and orbital contents are non-suspicious. OTHER: Patient is edentulous. IMPRESSION: 1. Moderately motion degraded examination. No acute intracranial process. 2. Moderate chronic small vessel ischemic changes. Electronically Signed   By: Awilda Metroourtnay  Bloomer M.D.   On: 02/06/2018 15:05   Dg Chest Portable 1 View  Result Date: 02/06/2018 CLINICAL DATA:  Episode of difficulty swallowing yesterday. Question aspiration. EXAM: PORTABLE CHEST 1 VIEW COMPARISON:  PA and lateral chest 09/09/2017. FINDINGS: Mild left basilar atelectasis or  scar is unchanged. Lungs otherwise clear. Heart size normal. No pneumothorax or pleural effusion. No acute bony abnormality. Severe bilateral glenohumeral osteoarthritis noted. IMPRESSION: Negative for aspiration.  No acute disease. Electronically Signed   By: Drusilla Kannerhomas  Dalessio M.D.   On: 02/06/2018 13:10    Procedures Procedures (including critical care time)  Medications Ordered in ED Medications  potassium chloride SA (K-DUR,KLOR-CON) CR tablet 40 mEq (has no administration in time range)  cefTRIAXone (ROCEPHIN) 1 g in sodium chloride 0.9 % 100 mL IVPB (0 g Intravenous Stopped 02/06/18 1615)  sodium chloride 0.9 % bolus 500 mL (0 mLs Intravenous Stopped 02/06/18 1616)     Initial Impression / Assessment and Plan / ED Course  I have reviewed the triage vital signs and the nursing notes.  Pertinent labs & imaging results that were available during my care of the patient were reviewed by me and considered in my medical decision making (see chart for details).    78 year old female with history of dementia presenting via EMS for left-sided facial droop, slurred speech, and weakness.  Onset of left-sided neuro symptoms began 8 PM last night and have been continuous since onset.  On arrival patient with questionable left-sided facial droop that family feels is slightly improved since onset.  No other neuro deficits found.  CT head negative for acute findings Chest x-ray negative EKG without acute changes reviewed by Dr. Particia NearingHaviland CBC with leukocytosis CMP with hypokalemia and mildly elevated creatinine PT/INR within normal limits APTT within normal limits Ethanol negative I-STAT troponin negative Urinalysis shows likely  UTI, patient without urinary symptoms, sent for culture Lactic 1.3 ------------------------- MRI without acute findings -------------------- Fluid bolus given, patient started on IV Rocephin, consult called for admission for generalized weakness, confusion and UTI. Vital  signs have remained stable.  Patient evaluated multiple times without complaint.  ------------------------ Patient seen and evaluated by admitting physician Dr. Renford DillsAdhikari.  Rediscussed case with Dr. Renford DillsAdhikari, and now seems patient and family do not wish to be admitted to the hospital and admitting team sees no reason for admission.  Dr Renford DillsAdhikari advises treatment outpatient with Keflex 500 twice daily as well as potassium supplementation and outpatient follow-ups.  Discussion held between Dr. Renford DillsAdhikari, Dr. Particia NearingHaviland and myself.  Plan at this time is to discharge with p.o. potassium, Keflex, PCP follow-up and continuation of patient's rehabilitation.  Creatinine clearance calculated, 500 mg twice daily seems appropriate for patient's GFR. ------------------- I have held a long discussion with patient and family, they wish to be discharged at this time with outpatient treatment and PCP follow-up.  I have discussed return precautions at length with patient and family at bedside.  At this time there does not appear to be any evidence of an acute emergency medical condition and the patient appears stable for discharge with appropriate outpatient follow up. Diagnosis was discussed with patient who verbalizes understanding of care plan and is agreeable to discharge. I have discussed return precautions with patient and daughter who verbalize understanding of return precautions. Patient strongly encouraged to follow-up with their PCP within one week. All questions answered.  Patient's case rediscussed with Dr. Particia NearingHaviland who agrees with plan to discharge with follow-up.   Note: Portions of this report may have been transcribed using voice recognition software. Every effort was made to ensure accuracy; however, inadvertent computerized transcription errors may still be present. Final Clinical Impressions(s) / ED Diagnoses   Final diagnoses:  Urinary tract infection with hematuria, site unspecified  Weakness     ED Discharge Orders         Ordered    cephALEXin (KEFLEX) 500 MG capsule  2 times daily     02/06/18 1608    potassium chloride (K-DUR) 10 MEQ tablet  Daily     02/06/18 1608           Elizabeth PalauMorelli, Snow Peoples A, PA-C 02/06/18 1656    Jacalyn LefevreHaviland, Julie, MD 02/07/18 [phone]

## 2018-02-06 NOTE — Discharge Instructions (Addendum)
You have been diagnosed today with urinary tract infection and generalized weakness.   At this time there does not appear to be the presence of an emergent medical condition, however there is always the potential for conditions to change. Please read and follow the below instructions.  Please return to the Emergency Department immediately for any new or worsening symptoms. Please be sure to follow up with your Primary Care Provider within one week regarding your visit today; please call their office to schedule an appointment even if you are feeling better for a follow-up visit. Please use the antibiotic Keflex as prescribed for the next 5 days.  Drink plenty water while taking this medication. Please use the potassium supplement as prescribed today. Please drink plenty water and get plenty of rest to help with your symptoms.  Please continue physical therapy and follow-up with your primary care provider this week.  Get help right away if: You feel confused. Your vision is blurry. You feel faint or you pass out. You have a severe headache. You have severe pain in your abdomen, your back, or the area between your waist and hips (pelvis). You have chest pain, shortness of breath, or an irregular or fast heartbeat. You are unable to urinate, or you urinate less than normal. You have abnormal bleeding, such as bleeding from the rectum, vagina, nose, lungs, or nipples. You vomit blood. You have thoughts about hurting yourself or others. Get help right away if: You have very bad back pain. You have very bad pain in your lower belly. You have a fever. You are sick to your stomach (nauseous). You are throwing up.  Please read the additional information packets attached to your discharge summary.  Do not take your medicine if  develop an itchy rash, swelling in your mouth or lips, or difficulty breathing.

## 2018-02-06 NOTE — ED Notes (Signed)
Patient transported to MRI 

## 2018-02-08 LAB — URINE CULTURE: Culture: >=100000 — AB

## 2018-02-09 ENCOUNTER — Encounter: Payer: Self-pay | Admitting: Nurse Practitioner

## 2018-02-09 ENCOUNTER — Telehealth: Payer: Self-pay | Admitting: Emergency Medicine

## 2018-02-09 NOTE — Telephone Encounter (Signed)
Post ED Visit - Positive Culture Follow-up  Culture report reviewed by antimicrobial stewardship pharmacist:  []  Enzo Bi, Pharm.D. []  Celedonio Miyamoto, Pharm.D., BCPS AQ-ID []  Garvin Fila, Pharm.D., BCPS []  Georgina Pillion, Pharm.D., BCPS []  [place], [address].D., BCPS, AAHIVP []  Estella Husk, Pharm.D., BCPS, AAHIVP []  Lysle Pearl, PharmD, BCPS []  Phillips Climes, PharmD, BCPS []  Agapito Games, PharmD, BCPS []  Verlan Friends, PharmD Babs Bertin PharmD  Positive urine culture Treated with cephalexin, organism sensitive to the same and no further patient follow-up is required at this time.  Berle Mull 02/09/2018, 10:32 AM

## 2018-02-10 ENCOUNTER — Ambulatory Visit: Payer: Medicare Other | Admitting: Physical Therapy

## 2018-02-11 ENCOUNTER — Encounter: Payer: Self-pay | Admitting: Nurse Practitioner

## 2018-02-11 ENCOUNTER — Other Ambulatory Visit: Payer: Medicare Other

## 2018-02-11 ENCOUNTER — Ambulatory Visit: Payer: Medicare Other | Admitting: Nurse Practitioner

## 2018-02-11 VITALS — BP 140/70 | HR 93 | Temp 98.3°F | Ht 62.0 in | Wt 121.0 lb

## 2018-02-11 DIAGNOSIS — E876 Hypokalemia: Secondary | ICD-10-CM

## 2018-02-11 DIAGNOSIS — N3000 Acute cystitis without hematuria: Secondary | ICD-10-CM

## 2018-02-11 DIAGNOSIS — I10 Essential (primary) hypertension: Secondary | ICD-10-CM

## 2018-02-11 LAB — BASIC METABOLIC PANEL
BUN: 24 mg/dL — AB (ref 6–23)
CO2: 28 mEq/L (ref 19–32)
Calcium: 8.5 mg/dL (ref 8.4–10.5)
Chloride: 105 mEq/L (ref 96–112)
Creatinine, Ser: 0.67 mg/dL (ref 0.40–1.20)
GFR: 85.01 mL/min (ref >60.00)
Glucose, Bld: 113 mg/dL — ABNORMAL HIGH (ref 70–99)
Potassium: 4.8 mEq/L (ref 3.5–5.1)
Sodium: 140 mEq/L (ref 135–145)

## 2018-02-11 LAB — CULTURE, BLOOD (ROUTINE X 2)
Culture: NO GROWTH
Culture: NO GROWTH
Special Requests: ADEQUATE

## 2018-02-11 NOTE — Patient Instructions (Addendum)
Hold furosemide (lasix) and potassium.  Abnormal urinalysis. Pending urine culture. Stable renal function and normal potassium.

## 2018-02-11 NOTE — Progress Notes (Signed)
Subjective:  Patient ID: Tricia DecampMiriam Daniel, female    DOB: 02/10/1940  Age: [age] y.o. MRN: [id number]  CC: Follow-up (Ed follow up on UTI--antibotic consult? )   HPI  Accompanied by daughter Tricia Councilmanlexandra.  UTI: Completed oral abx as prescribed. Normal BM after use of lactulose. daughter reports improved appetite, oral hydration, mental status, and BM. risperdal was discontinued and replace with seroquel. Continues to use prozac Mood is stable with minimal hallucination. Wt Readings from Last 3 Encounters:  02/11/18 121 lb (54.9 kg)  02/06/18 124 lb 12.5 oz (56.6 kg)  01/15/18 124 lb 12.8 oz (56.6 kg)   HTN: Stable with amlodipine. BP Readings from Last 3 Encounters:  02/11/18 140/70  02/06/18 115/65  02/03/18 110/74   Reviewed past Medical, Social and Family history today.  Outpatient Medications Prior to Visit  Medication Sig Dispense Refill  . amLODipine (NORVASC) 10 MG tablet Take 0.5 tablets (5 mg total) by mouth daily. 90 tablet 3  . Calcium Carbonate-Vitamin D (CALCIUM 600+D HIGH POTENCY) 600-400 MG-UNIT tablet Take 1 tablet by mouth daily.    . clotrimazole-betamethasone (LOTRISONE) cream Apply 1 application topically 2 (two) times daily. (Patient taking differently: Apply 1 application topically 2 (two) times daily as needed (rash). ) 45 g 1  . diclofenac sodium (VOLTAREN) 1 % GEL Apply 2 g topically 2 (two) times daily as needed (arthritis pain).     Marland Kitchen. donepezil (ARICEPT) 5 MG tablet Take 1 tablet (5 mg total) by mouth at bedtime. 30 tablet 1  . ENSURE PLUS (ENSURE PLUS) LIQD Take 237 mLs by mouth 2 (two) times daily between meals.    Marland Kitchen. etanercept (ENBREL SURECLICK) 50 MG/ML injection Inject 50 mg into the skin once a week. Sunday    . FLUoxetine (PROZAC) 10 MG capsule TAKE ONE CAPSULE BY MOUTH daily. Takes total of 50mg  once a day 90 capsule 3  . FLUoxetine (PROZAC) 40 MG capsule Take 1 capsule (40 mg total) by mouth daily. Takes total of 50mg  daily 90 capsule 3  . folic  acid (FOLVITE) 1 MG tablet Take 1 mg by mouth daily.  6  . ibuprofen (ADVIL,MOTRIN) 200 MG tablet Take 200-400 mg by mouth every 6 (six) hours as needed for headache or moderate pain.    Marland Kitchen. ipratropium (ATROVENT) 0.03 % nasal spray Place 1 spray into both nostrils 2 (two) times daily. Do not use for more than 5days. (Patient taking differently: Place 1 spray into both nostrils 2 (two) times daily as needed for rhinitis. Do not use for more than 5days.) 30 mL 0  . lactulose (CHRONULAC) 10 GM/15ML solution Take 15 mLs (10 g total) by mouth 2 (two) times daily as needed for moderate constipation. 236 mL 0  . methotrexate (RHEUMATREX) 2.5 MG tablet Take 12.5 mg by mouth once a week. 5 tablets every Thursday    . miconazole (MICOTIN) 2 % powder Apply topically 2 (two) times daily as needed for itching. 70 g 1  . Multiple Vitamins-Minerals (MULTIVITAMIN WOMEN 50+ PO) Take 1 tablet by mouth daily.    . predniSONE (DELTASONE) 5 MG tablet Take 5 mg by mouth daily with breakfast.   3  . QUEtiapine (SEROQUEL) 25 MG tablet Take 1 tablet (25 mg total) by mouth at bedtime. 30 tablet 2  . simvastatin (ZOCOR) 20 MG tablet TAKE ONE TABLET BY MOUTH nighly 90 tablet 2  . carbamide peroxide (DEBROX) 6.5 % OTIC solution Place 5 drops into both ears 2 (two) times daily. (Patient taking  differently: Place 5 drops into both ears 2 (two) times daily as needed (ear wax). ) 15 mL 0  . furosemide (LASIX) 20 MG tablet Take 1 tablet (20 mg total) by mouth daily. Needs office visit for additional refills 90 tablet 3  . potassium chloride SA (K-DUR,KLOR-CON) 20 MEQ tablet Take 1 tablet (20 mEq total) by mouth daily. 90 tablet 1  . cephALEXin (KEFLEX) 500 MG capsule Take 1 capsule (500 mg total) by mouth 2 (two) times daily for 5 days. (Patient not taking: Reported on 02/11/2018) 10 capsule 0  . potassium chloride (K-DUR) 10 MEQ tablet Take 4 tablets (40 mEq total) by mouth daily for 3 days. 12 tablet 0   No facility-administered  medications prior to visit.     ROS See HPI  Objective:  BP 140/70   Pulse 93   Temp 98.3 F (36.8 C) (Oral)   Ht 5\' 2"  (1.575 m)   Wt 121 lb (54.9 kg)   SpO2 94%   BMI 22.13 kg/m   BP Readings from Last 3 Encounters:  02/11/18 140/70  02/06/18 115/65  02/03/18 110/74    Wt Readings from Last 3 Encounters:  02/11/18 121 lb (54.9 kg)  02/06/18 124 lb 12.5 oz (56.6 kg)  01/15/18 124 lb 12.8 oz (56.6 kg)    Physical Exam Cardiovascular:     Rate and Rhythm: Normal rate and regular rhythm.  Pulmonary:     Effort: Pulmonary effort is normal.     Breath sounds: Normal breath sounds.  Abdominal:     General: Bowel sounds are normal.     Palpations: Abdomen is soft.     Tenderness: There is no abdominal tenderness.  Neurological:     Mental Status: She is alert.    Lab Results  Component Value Date   WBC 16.4 (H) 02/06/2018   HGB 13.4 02/06/2018   HCT 45.4 02/06/2018   PLT 213 02/06/2018   GLUCOSE 113 (H) 02/11/2018   CHOL 145 03/20/2017   TRIG 90.0 03/20/2017   HDL 75.50 03/20/2017   LDLCALC 52 03/20/2017   ALT 16 02/06/2018   AST 29 02/06/2018   NA 140 02/11/2018   K 4.8 02/11/2018   CL 105 02/11/2018   CREATININE 0.67 02/11/2018   BUN 24 (H) 02/11/2018   CO2 28 02/11/2018   TSH 1.87 09/09/2017   INR 1.13 02/06/2018   HGBA1C 5.1 03/20/2017    Ct Head Wo Contrast  Result Date: 02/06/2018 CLINICAL DATA:  Onset slurred speech and left eye drooping last night at 8 p.m. EXAM: CT HEAD WITHOUT CONTRAST TECHNIQUE: Contiguous axial images were obtained from the base of the skull through the vertex without intravenous contrast. COMPARISON:  None. FINDINGS: Brain: No evidence of acute infarction, hemorrhage, hydrocephalus, extra-axial collection or mass lesion/mass effect. Atrophy and chronic microvascular ischemic change noted. Vascular: No hyperdense vessel or unexpected calcification. Skull: Intact.  No focal lesion. Sinuses/Orbits: Negative. Other: None.  IMPRESSION: No acute abnormality. Atrophy and chronic microvascular ischemic change. Electronically Signed   By: Drusilla Kanner M.D.   On: 02/06/2018 13:31   Mr Brain Wo Contrast  Result Date: 02/06/2018 CLINICAL DATA:  Slurred speech, LEFT facial droop beginning last night. History of dementia, hypertension and hyperlipidemia. EXAM: MRI HEAD WITHOUT CONTRAST TECHNIQUE: Multiplanar, multiecho pulse sequences of the brain and surrounding structures were obtained without intravenous contrast. COMPARISON:  CT HEAD February 06, 2018 and MRI head April 18, 2017. FINDINGS: Moderately motion degraded sequences after multiple attempts. INTRACRANIAL CONTENTS:  No reduced diffusion to suggest acute ischemia. No susceptibility artifact to suggest hemorrhage. RIGHT cerebellar suspected developmental venous anomaly. Patchy supratentorial white matter T2 hyperintensities compatible. No advanced parenchymal brain volume loss for age. No hydrocephalus. No suspicious parenchymal signal, masses, mass effect. No abnormal extra-axial fluid collections. No extra-axial masses. VASCULAR: Normal major intracranial vascular flow voids present at skull base. SKULL AND UPPER CERVICAL SPINE: No abnormal sellar expansion. No suspicious calvarial bone marrow signal. Craniocervical junction maintained. SINUSES/ORBITS: The mastoid air-cells and included paranasal sinuses are well-aerated.The included ocular globes and orbital contents are non-suspicious. OTHER: Patient is edentulous. IMPRESSION: 1. Moderately motion degraded examination. No acute intracranial process. 2. Moderate chronic small vessel ischemic changes. Electronically Signed   By: Awilda Metro M.D.   On: 02/06/2018 15:05   Dg Chest Portable 1 View  Result Date: 02/06/2018 CLINICAL DATA:  Episode of difficulty swallowing yesterday. Question aspiration. EXAM: PORTABLE CHEST 1 VIEW COMPARISON:  PA and lateral chest 09/09/2017. FINDINGS: Mild left basilar atelectasis or  scar is unchanged. Lungs otherwise clear. Heart size normal. No pneumothorax or pleural effusion. No acute bony abnormality. Severe bilateral glenohumeral osteoarthritis noted. IMPRESSION: Negative for aspiration.  No acute disease. Electronically Signed   By: Drusilla Kanner M.D.   On: 02/06/2018 13:10    Assessment & Plan:   Alma was seen today for follow-up.  Diagnoses and all orders for this visit:  Essential hypertension -     Basic metabolic panel  Acute cystitis without hematuria -     Urinalysis w microscopic + reflex cultur  Hypokalemia -     Basic metabolic panel  Other orders -     REFLEXIVE URINE CULTURE -     Urine Culture   I have discontinued [facility]"'s carbamide peroxide, potassium chloride SA, furosemide, cephALEXin, and potassium chloride. I am also having her maintain her predniSONE, folic acid, methotrexate, diclofenac sodium, etanercept, miconazole, clotrimazole-betamethasone, FLUoxetine, FLUoxetine, ipratropium, simvastatin, lactulose, amLODipine, donepezil, QUEtiapine, Calcium Carbonate-Vitamin D, ibuprofen, Multiple Vitamins-Minerals (MULTIVITAMIN WOMEN 50+ PO), and ENSURE PLUS.  No orders of the defined types were placed in this encounter.   Problem List Items Addressed This Visit      Cardiovascular and Mediastinum   Essential hypertension - Primary   Relevant Orders   Basic metabolic panel (Completed)    Other Visit Diagnoses    Acute cystitis without hematuria       Relevant Orders   Urinalysis w microscopic + reflex cultur (Completed)   Hypokalemia       Relevant Orders   Basic metabolic panel (Completed)       Follow-up: Return in about 4 weeks (around 03/11/2018) for weight loss ( ).  Alysia Penna, NP

## 2018-02-12 ENCOUNTER — Encounter: Payer: Self-pay | Admitting: Nurse Practitioner

## 2018-02-13 ENCOUNTER — Encounter: Payer: Self-pay | Admitting: Physical Therapy

## 2018-02-13 ENCOUNTER — Encounter: Payer: Self-pay | Admitting: Nurse Practitioner

## 2018-02-13 ENCOUNTER — Ambulatory Visit: Payer: Medicare Other | Attending: Neurology | Admitting: Physical Therapy

## 2018-02-13 DIAGNOSIS — R2689 Other abnormalities of gait and mobility: Secondary | ICD-10-CM

## 2018-02-13 DIAGNOSIS — R296 Repeated falls: Secondary | ICD-10-CM | POA: Diagnosis present

## 2018-02-13 DIAGNOSIS — M6281 Muscle weakness (generalized): Secondary | ICD-10-CM | POA: Diagnosis present

## 2018-02-13 LAB — URINALYSIS W MICROSCOPIC + REFLEX CULTURE
BILIRUBIN URINE: NEGATIVE
Bacteria, UA: NONE SEEN /HPF
Glucose, UA: NEGATIVE
Hyaline Cast: NONE SEEN /LPF
Ketones, ur: NEGATIVE
Nitrites, Initial: NEGATIVE
Protein, ur: NEGATIVE
Specific Gravity, Urine: 1.015 (ref 1.001–1.03)
pH: <=5 (ref 5.0–8.0)

## 2018-02-13 LAB — URINE CULTURE
MICRO NUMBER:: 160291
SPECIMEN QUALITY:: ADEQUATE

## 2018-02-13 LAB — CULTURE INDICATED

## 2018-02-14 NOTE — Therapy (Signed)
[facility] [address] [place], [place], [address] Phone: [phone]   Fax:  [phone]  Physical Therapy Treatment  Patient Details  Name: Tricia Daniel MRN: [id number] Date of Birth: 11/26/1940 Referring Provider (PT): Willis,Charles MD   Encounter Date: 02/13/2018  PT End of Session - 02/13/18 1154    Visit Number  6    Number of Visits  16    Date for PT Re-Evaluation  03/13/18    Authorization Type  [employer] MCR - 10th visit PN    PT Start Time  1149    PT Stop Time  1227    PT Time Calculation (min)  38 min    Equipment Utilized During Treatment  Gait belt    Activity Tolerance  Patient limited by fatigue;Patient tolerated treatment well;No increased pain    Behavior During Therapy  WFL for tasks assessed/performed;Flat affect       Past Medical History:  Diagnosis Date  . Allergy   . Arthritis   . Chronic kidney disease   . Dementia (HCC)   . Depression   . Gait abnormality 04/04/2017  . Hyperlipidemia   . Hypertension     Past Surgical History:  Procedure Laterality Date  . COLOSTOMY  1988  . COLOSTOMY REVERSAL  1999  . nephrectomy Left 1988  . TONSILLECTOMY      There were no vitals filed for this visit.  Subjective Assessment - 02/13/18 1151    Subjective  Presents with her daughter. Had a recent UTI, done with antibiotic now. Daughter reports she has been more alert and talkative. Beginng to eat more at home as well. No falls. Still resistant with transfers, prefers son in law to lift her vs helping them.     Patient is accompained by:  Family member   daughter   Pertinent History  memory disturbance, hallucinations, incontinence,CKD,Dementia,    Limitations  Standing;Walking;House hold activities    How long can you stand comfortably?  2 min only    How long can you walk comfortably?  5 ft with rollator    Diagnostic tests  MRI from April unremarkable except for age related ischemic  changes, new MRI has been ordered    Patient Stated Goals  daughter relays work on transferring, gait especially turning and pivoting, balance    Currently in Pain?  No/denies    Pain Score  0-No pain          [facility] Adult PT Treatment/Exercise - 02/13/18 1717      Transfers   Transfers  Stand to Sit;Sit to Stand    Sit to Stand  3: Mod assist;From chair/3-in-1;From bed;With upper extremity assist    Sit to Stand Details  Manual facilitation for weight shifting;Manual facilitation for placement    Stand to Sit  4: Min assist;With upper extremity assist;To bed;To chair/3-in-1    Stand Pivot Transfers  3: Mod assist    Stand Pivot Transfer Details (indicate cue type and reason)  without device wheelchair to/from mat table, cues for upright posture and LE advancement. cues for pt to hold onto PTA arms, not around neck, needed with both transfers.     Comments  stood x 2 reps at edge of mat (in addition to stands for pivot transfers). min to mod assist working on hip extension, anterior wt shifting and then lateral weight shifitng when good posture was obtained.        Self-Care   Self-Care  Other Self-Care Comments      Other Self-Care Comments   obtained pt's measurements for daughter for proper fitting of new wheelchair.       Therapeutic Activites    Therapeutic Activities  Other Therapeutic Activities    Other Therapeutic Activities  seated at edge of mat engaged pt in activities to promote incr ant weight shifting- reaching to PTA hand in all directions on both sides, picking up/moving around cones on floor, mat table next to her.           PT Short Term Goals - 02/13/18 1155      PT SHORT TERM GOAL #1   Title  Pt will perform HEP consistently with daughter 3x/week.  02/16/18    Baseline  02/13/18: met with current HEP.     Status  Achieved      PT SHORT TERM GOAL #2   Title  Pt will peform TUG and BERG to further assess balance and will set goal for these accordingly. 4 weeks  02/16/18    Baseline  02/13/18: both have been done with past visits.     Status  Achieved        PT Long Term Goals - 01/21/18 1337      PT LONG TERM GOAL #1   Title  Pt will increase 5TSTS with UE assistance,  time to less than 25 sec to show improved balance and strength. 8 weeks 03/13/18    Baseline  30 seconds with UE on rollator    Status  Revised      PT LONG TERM GOAL #2   Title  Pt will be able to ambulate at least 50 ft with Min A and rollator/RW for household ambulation. 8 weeks 03/13/18    Baseline  mod A, 10 feet    Status  Revised      PT LONG TERM GOAL #3   Title  Pt will be able to perform transfers(sit to stand, stand pivot, and bed mobility) with min assist or better to decrease caregiver burden. 8 weeks 03/13/18    Baseline  min-mod A with rollator    Status  Revised      PT LONG TERM GOAL #4   Title  Pt will improve BERG score by 4 points demonstrating decreased reliance on UE    Baseline  TBD    Status  New    Target Date  03/13/18      PT LONG TERM GOAL #5   Title  Pt will improve TUG with rollator by 30 seconds demonstrating improved balance and safety    Baseline  3.09 minutes    Status  New    Target Date  03/13/18            Plan - 02/13/18 1154    Clinical Impression Statement  Pt more alert and paticipatory with therapy today. Continues to be fearful of transfers/falls, needing max cues/encouragement for ant weight shifitng. The pt is progressing slowly toward goals and should benefit from continued PT to progress toward unmet goals.    Rehab Potential  Fair    Clinical Impairments Affecting Rehab Potential  cognitive impairments, dementia    PT Frequency  2x / week    PT Duration  8 weeks    PT Treatment/Interventions  ADLs/Self Care Home Management;Moist Heat;Gait training;Stair training;Functional mobility training;Therapeutic activities;Therapeutic exercise;Balance training;Neuromuscular re-education;Cognitive remediation;Patient/family  education;Wheelchair mobility training;Manual techniques;Passive range of motion;Energy conservation    PT Next Visit Plan  continue with standing at sink working on posture, weight shifting,   standing tolerance;  Transfer training, gait training with rollator.  Daugher given pt measurements for new wheelchair, follow up on where this process stands.     PT Home Exercise Plan  [id number]    Consulted and Agree with Plan of Care  Patient;Family member/caregiver    Family Member Consulted  daughter       Patient will benefit from skilled therapeutic intervention in order to improve the following deficits and impairments:  Abnormal gait, Decreased activity tolerance, Decreased endurance, Decreased balance, Decreased knowledge of precautions, Decreased safety awareness, Decreased range of motion, Decreased strength, Difficulty walking, Postural dysfunction, Pain  Visit Diagnosis: Other abnormalities of gait and mobility  Repeated falls  Muscle weakness (generalized)     Problem List Patient Active Problem List   Diagnosis Date Noted  . Altered mental status 02/06/2018  . Hallucinations 12/09/2017  . Weight loss, unintentional 09/09/2017  . Gait abnormality 04/04/2017  . Mixed hyperlipidemia 03/21/2017  . Polycystic kidney disease 03/20/2017  . Essential hypertension 03/20/2017  . Asymptomatic postmenopausal estrogen deficiency 03/20/2017  . Bilateral hearing loss 03/20/2017  . Depression, major, single episode, complete remission (HCC) 03/20/2017  . Memory deficit 03/20/2017  . H/O unilateral nephrectomy 03/20/2017  . S/P partial colectomy 03/20/2017  . Chronic cystitis 06/27/2013  . Congenital polycystic kidney 07/07/2012  . Acquired absence of kidney 07/07/2012    Kathy Bury, PTA, CLT [facility] [address], [address] [place], [place] [address] [phone] 02/14/18, 5:22 PM   Name: Tricia Daniel MRN: [id number] Date of Birth: 01/01/1941   

## 2018-02-16 ENCOUNTER — Encounter: Payer: Self-pay | Admitting: Nurse Practitioner

## 2018-02-16 ENCOUNTER — Ambulatory Visit (INDEPENDENT_AMBULATORY_CARE_PROVIDER_SITE_OTHER): Payer: Medicare Other

## 2018-02-16 ENCOUNTER — Ambulatory Visit (INDEPENDENT_AMBULATORY_CARE_PROVIDER_SITE_OTHER): Payer: Medicare Other | Admitting: Nurse Practitioner

## 2018-02-16 ENCOUNTER — Ambulatory Visit: Payer: Medicare Other | Admitting: Physical Therapy

## 2018-02-16 VITALS — BP 142/78 | HR 78 | Temp 97.9°F | Ht 62.0 in | Wt 119.6 lb

## 2018-02-16 DIAGNOSIS — J189 Pneumonia, unspecified organism: Secondary | ICD-10-CM

## 2018-02-16 DIAGNOSIS — R509 Fever, unspecified: Secondary | ICD-10-CM

## 2018-02-16 DIAGNOSIS — R05 Cough: Secondary | ICD-10-CM | POA: Diagnosis not present

## 2018-02-16 LAB — POC INFLUENZA A&B (BINAX/QUICKVUE)
Influenza A, POC: NEGATIVE
Influenza B, POC: NEGATIVE

## 2018-02-16 MED ORDER — LEVOFLOXACIN 500 MG PO TABS: 500.0000 mg | ORAL_TABLET | Freq: Every day | ORAL | 0 refills | Status: AC

## 2018-02-16 MED ORDER — BENZONATATE 100 MG PO CAPS: 100.0000 mg | ORAL_CAPSULE | Freq: Three times a day (TID) | ORAL | 0 refills | Status: AC | PRN

## 2018-02-16 MED ORDER — GUAIFENESIN-DM 100-10 MG/5ML PO SYRP: 5.0000 mL | ORAL_SOLUTION | ORAL | 0 refills | Status: AC | PRN

## 2018-02-16 NOTE — Addendum Note (Signed)
Addended by: Arva Chafe on: 02/16/2018 04:57 PM   Modules accepted: Orders

## 2018-02-16 NOTE — Progress Notes (Addendum)
Subjective:  Patient ID: Tricia Daniel, female    DOB: Apr 10, 1940  Age: 78 y.o. MRN: [id number]  CC: Cough (coughing yellow mucus,runny nose,sweating,100.3 temp--going on 1 night. blood in urine today. )  Cough  This is a new problem. The current episode started yesterday. The problem has been unchanged. The cough is non-productive. Associated symptoms include chills, a fever, shortness of breath and sweats. Pertinent negatives include no chest pain, nasal congestion, postnasal drip, rhinorrhea, sore throat or wheezing. The symptoms are aggravated by lying down. She has tried OTC cough suppressant for the symptoms.   Continuous weight loss noted.  Reviewed past Medical, Social and Family history today.  Outpatient Medications Prior to Visit  Medication Sig Dispense Refill  . amLODipine (NORVASC) 10 MG tablet Take 0.5 tablets (5 mg total) by mouth daily. 90 tablet 3  . Calcium Carbonate-Vitamin D (CALCIUM 600+D HIGH POTENCY) 600-400 MG-UNIT tablet Take 1 tablet by mouth daily.    . clotrimazole-betamethasone (LOTRISONE) cream Apply 1 application topically 2 (two) times daily. (Patient taking differently: Apply 1 application topically 2 (two) times daily as needed (rash). ) 45 g 1  . diclofenac sodium (VOLTAREN) 1 % GEL Apply 2 g topically 2 (two) times daily as needed (arthritis pain).     Marland Kitchen donepezil (ARICEPT) 5 MG tablet Take 1 tablet (5 mg total) by mouth at bedtime. 30 tablet 1  . ENSURE PLUS (ENSURE PLUS) LIQD Take 237 mLs by mouth 2 (two) times daily between meals.    Marland Kitchen etanercept (ENBREL SURECLICK) 50 MG/ML injection Inject 50 mg into the skin once a week. Sunday    . FLUoxetine (PROZAC) 10 MG capsule TAKE ONE CAPSULE BY MOUTH daily. Takes total of 50mg  once a day 90 capsule 3  . FLUoxetine (PROZAC) 40 MG capsule Take 1 capsule (40 mg total) by mouth daily. Takes total of 50mg  daily 90 capsule 3  . folic acid (FOLVITE) 1 MG tablet Take 1 mg by mouth daily.  6  . ibuprofen  (ADVIL,MOTRIN) 200 MG tablet Take 200-400 mg by mouth every 6 (six) hours as needed for headache or moderate pain.    Marland Kitchen ipratropium (ATROVENT) 0.03 % nasal spray Place 1 spray into both nostrils 2 (two) times daily. Do not use for more than 5days. (Patient taking differently: Place 1 spray into both nostrils 2 (two) times daily as needed for rhinitis. Do not use for more than 5days.) 30 mL 0  . lactulose (CHRONULAC) 10 GM/15ML solution Take 15 mLs (10 g total) by mouth 2 (two) times daily as needed for moderate constipation. 236 mL 0  . methotrexate (RHEUMATREX) 2.5 MG tablet Take 12.5 mg by mouth once a week. 5 tablets every Thursday    . miconazole (MICOTIN) 2 % powder Apply topically 2 (two) times daily as needed for itching. 70 g 1  . Multiple Vitamins-Minerals (MULTIVITAMIN WOMEN 50+ PO) Take 1 tablet by mouth daily.    . predniSONE (DELTASONE) 5 MG tablet Take 5 mg by mouth daily with breakfast.   3  . QUEtiapine (SEROQUEL) 25 MG tablet Take 1 tablet (25 mg total) by mouth at bedtime. 30 tablet 2  . simvastatin (ZOCOR) 20 MG tablet TAKE ONE TABLET BY MOUTH nighly 90 tablet 2   No facility-administered medications prior to visit.     ROS See HPI  Objective:  BP (!) 142/78   Pulse 78   Temp 97.9 F (36.6 C) (Oral)   Ht 5\' 2"  (1.575 m)   Wt  119 lb 9.6 oz (54.3 kg)   SpO2 95%   BMI 21.88 kg/m   BP Readings from Last 3 Encounters:  02/16/18 (!) 142/78  02/11/18 140/70  02/06/18 115/65    Wt Readings from Last 3 Encounters:  02/16/18 119 lb 9.6 oz (54.3 kg)  02/11/18 121 lb (54.9 kg)  02/06/18 124 lb 12.5 oz (56.6 kg)    Physical Exam Cardiovascular:     Rate and Rhythm: Normal rate.  Pulmonary:     Effort: Pulmonary effort is normal. No respiratory distress.     Breath sounds: No stridor. Rhonchi and rales present.  Chest:     Chest wall: No tenderness.  Abdominal:     General: Bowel sounds are normal.     Palpations: Abdomen is soft.  Musculoskeletal:     Right  lower leg: No edema.     Left lower leg: No edema.  Neurological:     Mental Status: She is alert.     Lab Results  Component Value Date   WBC 16.4 (H) 02/06/2018   HGB 13.4 02/06/2018   HCT 45.4 02/06/2018   PLT 213 02/06/2018   GLUCOSE 113 (H) 02/11/2018   CHOL 145 03/20/2017   TRIG 90.0 03/20/2017   HDL 75.50 03/20/2017   LDLCALC 52 03/20/2017   ALT 16 02/06/2018   AST 29 02/06/2018   NA 140 02/11/2018   K 4.8 02/11/2018   CL 105 02/11/2018   CREATININE 0.67 02/11/2018   BUN 24 (H) 02/11/2018   CO2 28 02/11/2018   TSH 1.87 09/09/2017   INR 1.13 02/06/2018   HGBA1C 5.1 03/20/2017    Ct Head Wo Contrast  Result Date: 02/06/2018 CLINICAL DATA:  Onset slurred speech and left eye drooping last night at 8 p.m. EXAM: CT HEAD WITHOUT CONTRAST TECHNIQUE: Contiguous axial images were obtained from the base of the skull through the vertex without intravenous contrast. COMPARISON:  None. FINDINGS: Brain: No evidence of acute infarction, hemorrhage, hydrocephalus, extra-axial collection or mass lesion/mass effect. Atrophy and chronic microvascular ischemic change noted. Vascular: No hyperdense vessel or unexpected calcification. Skull: Intact.  No focal lesion. Sinuses/Orbits: Negative. Other: None. IMPRESSION: No acute abnormality. Atrophy and chronic microvascular ischemic change. Electronically Signed   By: Drusilla Kannerhomas  Dalessio M.D.   On: 02/06/2018 13:31   Mr Brain Wo Contrast  Result Date: 02/06/2018 CLINICAL DATA:  Slurred speech, LEFT facial droop beginning last night. History of dementia, hypertension and hyperlipidemia. EXAM: MRI HEAD WITHOUT CONTRAST TECHNIQUE: Multiplanar, multiecho pulse sequences of the brain and surrounding structures were obtained without intravenous contrast. COMPARISON:  CT HEAD February 06, 2018 and MRI head April 18, 2017. FINDINGS: Moderately motion degraded sequences after multiple attempts. INTRACRANIAL CONTENTS: No reduced diffusion to suggest acute  ischemia. No susceptibility artifact to suggest hemorrhage. RIGHT cerebellar suspected developmental venous anomaly. Patchy supratentorial white matter T2 hyperintensities compatible. No advanced parenchymal brain volume loss for age. No hydrocephalus. No suspicious parenchymal signal, masses, mass effect. No abnormal extra-axial fluid collections. No extra-axial masses. VASCULAR: Normal major intracranial vascular flow voids present at skull base. SKULL AND UPPER CERVICAL SPINE: No abnormal sellar expansion. No suspicious calvarial bone marrow signal. Craniocervical junction maintained. SINUSES/ORBITS: The mastoid air-cells and included paranasal sinuses are well-aerated.The included ocular globes and orbital contents are non-suspicious. OTHER: Patient is edentulous. IMPRESSION: 1. Moderately motion degraded examination. No acute intracranial process. 2. Moderate chronic small vessel ischemic changes. Electronically Signed   By: Awilda Metroourtnay  Bloomer M.D.   On: 02/06/2018 15:05  Dg Chest Portable 1 View  Result Date: 02/06/2018 CLINICAL DATA:  Episode of difficulty swallowing yesterday. Question aspiration. EXAM: PORTABLE CHEST 1 VIEW COMPARISON:  PA and lateral chest 09/09/2017. FINDINGS: Mild left basilar atelectasis or scar is unchanged. Lungs otherwise clear. Heart size normal. No pneumothorax or pleural effusion. No acute bony abnormality. Severe bilateral glenohumeral osteoarthritis noted. IMPRESSION: Negative for aspiration.  No acute disease. Electronically Signed   By: Drusilla Kannerhomas  Dalessio M.D.   On: 02/06/2018 13:10    Assessment & Plan:   Tricia PartridgeMiriam was seen today for cough.  Diagnoses and all orders for this visit:  HCAP (healthcare-associated pneumonia) -     POC Influenza A&B(BINAX/QUICKVUE) -     DG Chest 2 View -     benzonatate (TESSALON) 100 MG capsule; Take 1 capsule (100 mg total) by mouth 3 (three) times daily as needed for cough. -     guaiFENesin-dextromethorphan (ROBITUSSIN DM) 100-10  MG/5ML syrup; Take 5 mLs by mouth every 4 (four) hours as needed for cough. -     Cancel: Urinalysis with Culture Reflex; Future -     levofloxacin (LEVAQUIN) 500 MG tablet; Take 1 tablet (500 mg total) by mouth daily. -     DG Chest 2 View; Future  Fever, unspecified fever cause -     POC Influenza A&B(BINAX/QUICKVUE) -     Cancel: Urinalysis w microscopic + reflex cultur -     DG Chest 2 View -     Cancel: Urinalysis with Culture Reflex; Future -     levofloxacin (LEVAQUIN) 500 MG tablet; Take 1 tablet (500 mg total) by mouth daily. -     DG Chest 2 View; Future -     Urinalysis with Culture Reflex; Future   I am having Brightiside SurgicalMiriam Freimuth "Dondra SpryGail" start on benzonatate, guaiFENesin-dextromethorphan, and levofloxacin. I am also having her maintain her predniSONE, folic acid, methotrexate, diclofenac sodium, etanercept, miconazole, clotrimazole-betamethasone, FLUoxetine, FLUoxetine, ipratropium, simvastatin, lactulose, amLODipine, donepezil, QUEtiapine, Calcium Carbonate-Vitamin D, ibuprofen, Multiple Vitamins-Minerals (MULTIVITAMIN WOMEN 50+ PO), and ENSURE PLUS.  Meds ordered this encounter  Medications  . benzonatate (TESSALON) 100 MG capsule    Sig: Take 1 capsule (100 mg total) by mouth 3 (three) times daily as needed for cough.    Dispense:  20 capsule    Refill:  0    Order Specific Question:   Supervising Provider    Answer:   Dianne DunARON, TALIA M [[id number]]  . guaiFENesin-dextromethorphan (ROBITUSSIN DM) 100-10 MG/5ML syrup    Sig: Take 5 mLs by mouth every 4 (four) hours as needed for cough.    Dispense:  118 mL    Refill:  0    Order Specific Question:   Supervising Provider    Answer:   Dianne DunARON, TALIA M [[id number]]  . levofloxacin (LEVAQUIN) 500 MG tablet    Sig: Take 1 tablet (500 mg total) by mouth daily.    Dispense:  7 tablet    Refill:  0    Order Specific Question:   Supervising Provider    Answer:   Dianne DunARON, TALIA M [[id number]]    Problem List Items Addressed This Visit    None    Visit  Diagnoses    HCAP (healthcare-associated pneumonia)    -  Primary   Relevant Medications   benzonatate (TESSALON) 100 MG capsule   guaiFENesin-dextromethorphan (ROBITUSSIN DM) 100-10 MG/5ML syrup   levofloxacin (LEVAQUIN) 500 MG tablet   Other Relevant Orders   POC Influenza A&B(BINAX/QUICKVUE) (Completed)   DG  Chest 2 View (Completed)   DG Chest 2 View   Fever, unspecified fever cause       Relevant Medications   levofloxacin (LEVAQUIN) 500 MG tablet   Other Relevant Orders   POC Influenza A&B(BINAX/QUICKVUE) (Completed)   DG Chest 2 View (Completed)   DG Chest 2 View   Urinalysis with Culture Reflex       Follow-up: Return in about 4 weeks (around 03/16/2018) for HTN and weight loss.  Alysia Penna, NP

## 2018-02-16 NOTE — Patient Instructions (Addendum)
CXR indicates RLL pneumonia. levaquin sent. Also start florastor probiotic 1cap BID. Continue to hold methotrexate and contact rheumatology. Return to lab for repeat CXR in 3weeks. Consider referral to GI to evaluate to possible silent aspiration.  Influenza swab is negative.  Ok to alternate between benzonatate and robitussin every 4hrs as needed for cough.  Maintain adequate oral hydration.  Let me know if you want further testing to determine cause of continuous weight loss.

## 2018-02-17 ENCOUNTER — Encounter: Payer: Self-pay | Admitting: Nurse Practitioner

## 2018-02-17 ENCOUNTER — Telehealth: Payer: Self-pay | Admitting: Nurse Practitioner

## 2018-02-17 DIAGNOSIS — R05 Cough: Secondary | ICD-10-CM

## 2018-02-17 DIAGNOSIS — R059 Cough, unspecified: Secondary | ICD-10-CM

## 2018-02-17 DIAGNOSIS — J181 Lobar pneumonia, unspecified organism: Principal | ICD-10-CM

## 2018-02-17 DIAGNOSIS — J189 Pneumonia, unspecified organism: Secondary | ICD-10-CM

## 2018-02-17 NOTE — Telephone Encounter (Signed)
-----   Message from Livingston Diones, LPN sent at 6/60/6301  8:49 AM EST ----- Notified pt through mychart.   Pt would like referral to GI.

## 2018-02-18 ENCOUNTER — Encounter: Payer: Self-pay | Admitting: Nurse Practitioner

## 2018-02-18 NOTE — Telephone Encounter (Signed)
Spoke with pt's daughter 2pm, she report that Tricia Daniel is alert and oriented, started to move around more than this this morning and she is able to eat (banana pudding) more. Temp 99.6 under arm.   Tricia Daniel is aware and advise daughter to continue to monitor her symptoms, continue her med as direct, appt made 02/23/2018 for follow up with [facility]. If symptoms gets worse, please take Tricia Daniel to the ED. Daughter verbalized understand.

## 2018-02-19 ENCOUNTER — Emergency Department (HOSPITAL_COMMUNITY): Payer: Medicare Other

## 2018-02-19 ENCOUNTER — Encounter: Payer: Self-pay | Admitting: Nurse Practitioner

## 2018-02-19 ENCOUNTER — Other Ambulatory Visit: Payer: Self-pay

## 2018-02-19 ENCOUNTER — Encounter (HOSPITAL_COMMUNITY): Payer: Self-pay

## 2018-02-19 ENCOUNTER — Inpatient Hospital Stay (HOSPITAL_COMMUNITY)
Admission: EM | Admit: 2018-02-19 | Discharge: 2018-03-08 | DRG: 871 | Disposition: E | Payer: Medicare Other | Attending: Internal Medicine | Admitting: Internal Medicine

## 2018-02-19 DIAGNOSIS — I10 Essential (primary) hypertension: Secondary | ICD-10-CM | POA: Diagnosis not present

## 2018-02-19 DIAGNOSIS — E440 Moderate protein-calorie malnutrition: Secondary | ICD-10-CM | POA: Diagnosis present

## 2018-02-19 DIAGNOSIS — I129 Hypertensive chronic kidney disease with stage 1 through stage 4 chronic kidney disease, or unspecified chronic kidney disease: Secondary | ICD-10-CM | POA: Diagnosis present

## 2018-02-19 DIAGNOSIS — E876 Hypokalemia: Secondary | ICD-10-CM

## 2018-02-19 DIAGNOSIS — R269 Unspecified abnormalities of gait and mobility: Secondary | ICD-10-CM | POA: Diagnosis present

## 2018-02-19 DIAGNOSIS — Z515 Encounter for palliative care: Secondary | ICD-10-CM | POA: Diagnosis not present

## 2018-02-19 DIAGNOSIS — M19011 Primary osteoarthritis, right shoulder: Secondary | ICD-10-CM | POA: Diagnosis present

## 2018-02-19 DIAGNOSIS — J189 Pneumonia, unspecified organism: Secondary | ICD-10-CM

## 2018-02-19 DIAGNOSIS — M19012 Primary osteoarthritis, left shoulder: Secondary | ICD-10-CM | POA: Diagnosis present

## 2018-02-19 DIAGNOSIS — Z825 Family history of asthma and other chronic lower respiratory diseases: Secondary | ICD-10-CM | POA: Diagnosis not present

## 2018-02-19 DIAGNOSIS — J690 Pneumonitis due to inhalation of food and vomit: Secondary | ICD-10-CM | POA: Diagnosis present

## 2018-02-19 DIAGNOSIS — Z8249 Family history of ischemic heart disease and other diseases of the circulatory system: Secondary | ICD-10-CM

## 2018-02-19 DIAGNOSIS — Z66 Do not resuscitate: Secondary | ICD-10-CM | POA: Diagnosis present

## 2018-02-19 DIAGNOSIS — Z791 Long term (current) use of non-steroidal anti-inflammatories (NSAID): Secondary | ICD-10-CM | POA: Diagnosis not present

## 2018-02-19 DIAGNOSIS — R9431 Abnormal electrocardiogram [ECG] [EKG]: Secondary | ICD-10-CM | POA: Diagnosis not present

## 2018-02-19 DIAGNOSIS — Z7952 Long term (current) use of systemic steroids: Secondary | ICD-10-CM

## 2018-02-19 DIAGNOSIS — R1312 Dysphagia, oropharyngeal phase: Secondary | ICD-10-CM | POA: Diagnosis present

## 2018-02-19 DIAGNOSIS — F05 Delirium due to known physiological condition: Secondary | ICD-10-CM | POA: Diagnosis present

## 2018-02-19 DIAGNOSIS — E782 Mixed hyperlipidemia: Secondary | ICD-10-CM | POA: Diagnosis present

## 2018-02-19 DIAGNOSIS — Z79899 Other long term (current) drug therapy: Secondary | ICD-10-CM

## 2018-02-19 DIAGNOSIS — L89320 Pressure ulcer of left buttock, unstageable: Secondary | ICD-10-CM | POA: Diagnosis present

## 2018-02-19 DIAGNOSIS — M069 Rheumatoid arthritis, unspecified: Secondary | ICD-10-CM | POA: Diagnosis present

## 2018-02-19 DIAGNOSIS — A419 Sepsis, unspecified organism: Principal | ICD-10-CM | POA: Diagnosis present

## 2018-02-19 DIAGNOSIS — Z833 Family history of diabetes mellitus: Secondary | ICD-10-CM

## 2018-02-19 DIAGNOSIS — L4050 Arthropathic psoriasis, unspecified: Secondary | ICD-10-CM | POA: Diagnosis present

## 2018-02-19 DIAGNOSIS — J9601 Acute respiratory failure with hypoxia: Secondary | ICD-10-CM | POA: Diagnosis present

## 2018-02-19 DIAGNOSIS — N189 Chronic kidney disease, unspecified: Secondary | ICD-10-CM | POA: Diagnosis present

## 2018-02-19 DIAGNOSIS — Z6823 Body mass index (BMI) 23.0-23.9, adult: Secondary | ICD-10-CM | POA: Diagnosis not present

## 2018-02-19 DIAGNOSIS — L8990 Pressure ulcer of unspecified site, unspecified stage: Secondary | ICD-10-CM | POA: Diagnosis present

## 2018-02-19 DIAGNOSIS — F03918 Unspecified dementia, unspecified severity, with other behavioral disturbance: Secondary | ICD-10-CM | POA: Diagnosis present

## 2018-02-19 DIAGNOSIS — T17908D Unspecified foreign body in respiratory tract, part unspecified causing other injury, subsequent encounter: Secondary | ICD-10-CM | POA: Diagnosis not present

## 2018-02-19 DIAGNOSIS — F329 Major depressive disorder, single episode, unspecified: Secondary | ICD-10-CM | POA: Diagnosis present

## 2018-02-19 DIAGNOSIS — Z792 Long term (current) use of antibiotics: Secondary | ICD-10-CM

## 2018-02-19 DIAGNOSIS — Z905 Acquired absence of kidney: Secondary | ICD-10-CM

## 2018-02-19 DIAGNOSIS — Z7189 Other specified counseling: Secondary | ICD-10-CM | POA: Diagnosis not present

## 2018-02-19 DIAGNOSIS — F0391 Unspecified dementia with behavioral disturbance: Secondary | ICD-10-CM | POA: Diagnosis present

## 2018-02-19 DIAGNOSIS — Z7401 Bed confinement status: Secondary | ICD-10-CM

## 2018-02-19 DIAGNOSIS — Z8673 Personal history of transient ischemic attack (TIA), and cerebral infarction without residual deficits: Secondary | ICD-10-CM

## 2018-02-19 DIAGNOSIS — T17908A Unspecified foreign body in respiratory tract, part unspecified causing other injury, initial encounter: Secondary | ICD-10-CM

## 2018-02-19 LAB — COMPREHENSIVE METABOLIC PANEL
ALT: 15 U/L (ref 0–44)
AST: 33 U/L (ref 15–41)
Albumin: 2.9 g/dL — ABNORMAL LOW (ref 3.5–5.0)
Alkaline Phosphatase: 72 U/L (ref 38–126)
Anion gap: 9 (ref 5–15)
BUN: 23 mg/dL (ref 8–23)
CO2: 20 mmol/L — AB (ref 22–32)
Calcium: 8.2 mg/dL — ABNORMAL LOW (ref 8.9–10.3)
Chloride: 110 mmol/L (ref 98–111)
Creatinine, Ser: 0.82 mg/dL (ref 0.44–1.00)
GFR calc Af Amer: >60 mL/min (ref >60)
GFR calc non Af Amer: >60 mL/min (ref >60)
Glucose, Bld: 170 mg/dL — ABNORMAL HIGH (ref 70–99)
Potassium: 2.8 mmol/L — ABNORMAL LOW (ref 3.5–5.1)
SODIUM: 139 mmol/L (ref 135–145)
Total Bilirubin: 0.5 mg/dL (ref 0.3–1.2)
Total Protein: 7.1 g/dL (ref 6.5–8.1)

## 2018-02-19 LAB — CBC WITH DIFFERENTIAL/PLATELET
Abs Immature Granulocytes: 0.12 10*3/uL — ABNORMAL HIGH (ref 0.00–0.07)
Basophils Absolute: 0 10*3/uL (ref 0.0–0.1)
Basophils Relative: 0 %
Eosinophils Absolute: 0.3 10*3/uL (ref 0.0–0.5)
Eosinophils Relative: 2 %
HCT: 40 % (ref 36.0–46.0)
Hemoglobin: 12.1 g/dL (ref 12.0–15.0)
Immature Granulocytes: 1 %
Lymphocytes Relative: 11 %
Lymphs Abs: 1.3 10*3/uL (ref 0.7–4.0)
MCH: 29.2 pg (ref 26.0–34.0)
MCHC: 30.3 g/dL (ref 30.0–36.0)
MCV: 96.4 fL (ref 80.0–100.0)
Monocytes Absolute: 1.3 10*3/uL — ABNORMAL HIGH (ref 0.1–1.0)
Monocytes Relative: 10 %
Neutro Abs: 9.2 10*3/uL — ABNORMAL HIGH (ref 1.7–7.7)
Neutrophils Relative %: 76 %
Platelets: 359 10*3/uL (ref 150–400)
RBC: 4.15 MIL/uL (ref 3.87–5.11)
RDW: 16.8 % — ABNORMAL HIGH (ref 11.5–15.5)
WBC: 12.2 10*3/uL — ABNORMAL HIGH (ref 4.0–10.5)
nRBC: 0 % (ref 0.0–0.2)

## 2018-02-19 LAB — LACTIC ACID, PLASMA
Lactic Acid, Venous: 2 mmol/L (ref 0.5–1.9)
Lactic Acid, Venous: 1.4 mmol/L (ref 0.5–1.9)

## 2018-02-19 LAB — MAGNESIUM: Magnesium: 1.9 mg/dL (ref 1.7–2.4)

## 2018-02-19 LAB — PROTIME-INR
INR: 1.1
PROTHROMBIN TIME: 14.1 s (ref 11.4–15.2)

## 2018-02-19 LAB — POCT I-STAT TROPONIN I: Troponin i, poc: 0.02 ng/mL (ref 0.00–0.08)

## 2018-02-19 MED ORDER — SODIUM CHLORIDE 0.9% FLUSH: 3.0000 mL | Freq: Once | INTRAVENOUS | Status: DC

## 2018-02-19 MED ORDER — ENOXAPARIN SODIUM 40 MG/0.4ML ~~LOC~~ SOLN
40.0000 mg | SUBCUTANEOUS | Status: DC
  Administered 2018-02-19 – 2018-02-21 (×3): 40 mg via SUBCUTANEOUS
  Filled 2018-02-19 (×3): qty 0.4

## 2018-02-19 MED ORDER — QUETIAPINE FUMARATE 25 MG PO TABS
25.0000 mg | ORAL_TABLET | Freq: Every day | ORAL | Status: DC
  Administered 2018-02-19 – 2018-02-21 (×3): 25 mg via ORAL
  Filled 2018-02-19 (×3): qty 1

## 2018-02-19 MED ORDER — FLUOXETINE HCL 10 MG PO CAPS
10.0000 mg | ORAL_CAPSULE | Freq: Every day | ORAL | Status: DC
  Administered 2018-02-19 – 2018-02-21 (×3): 10 mg via ORAL
  Filled 2018-02-19 (×4): qty 1

## 2018-02-19 MED ORDER — PREDNISONE 5 MG PO TABS
5.0000 mg | ORAL_TABLET | Freq: Every day | ORAL | Status: DC
  Administered 2018-02-20 – 2018-02-21 (×2): 5 mg via ORAL
  Filled 2018-02-19 (×3): qty 1

## 2018-02-19 MED ORDER — SIMVASTATIN 20 MG PO TABS
20.0000 mg | ORAL_TABLET | Freq: Every evening | ORAL | Status: DC
  Administered 2018-02-20 – 2018-02-21 (×2): 20 mg via ORAL
  Filled 2018-02-19 (×2): qty 1

## 2018-02-19 MED ORDER — HALOPERIDOL LACTATE 5 MG/ML IJ SOLN: 2.0000 mg | Freq: Four times a day (QID) | INTRAMUSCULAR | Status: DC | PRN

## 2018-02-19 MED ORDER — IPRATROPIUM-ALBUTEROL 0.5-2.5 (3) MG/3ML IN SOLN
3.0000 mL | Freq: Once | RESPIRATORY_TRACT | Status: AC
  Administered 2018-02-19: 3 mL via RESPIRATORY_TRACT
  Filled 2018-02-19: qty 3

## 2018-02-19 MED ORDER — FLUOXETINE HCL 20 MG PO CAPS
40.0000 mg | ORAL_CAPSULE | Freq: Every day | ORAL | Status: DC
  Administered 2018-02-20 – 2018-02-21 (×2): 40 mg via ORAL
  Filled 2018-02-19 (×3): qty 2

## 2018-02-19 MED ORDER — LACTULOSE 10 GM/15ML PO SOLN: 10.0000 g | Freq: Two times a day (BID) | ORAL | Status: DC | PRN

## 2018-02-19 MED ORDER — SODIUM CHLORIDE 0.9 % IV SOLN
3.0000 g | Freq: Three times a day (TID) | INTRAVENOUS | Status: DC
  Administered 2018-02-20 (×2): 3 g via INTRAVENOUS
  Filled 2018-02-19 (×4): qty 3

## 2018-02-19 MED ORDER — ACETAMINOPHEN 650 MG RE SUPP: 650.0000 mg | Freq: Four times a day (QID) | RECTAL | Status: DC | PRN

## 2018-02-19 MED ORDER — AMLODIPINE BESYLATE 5 MG PO TABS
5.0000 mg | ORAL_TABLET | Freq: Every day | ORAL | Status: DC
  Administered 2018-02-20 – 2018-02-21 (×2): 5 mg via ORAL
  Filled 2018-02-19 (×3): qty 1

## 2018-02-19 MED ORDER — DONEPEZIL HCL 5 MG PO TABS
5.0000 mg | ORAL_TABLET | Freq: Every day | ORAL | Status: DC
  Administered 2018-02-19 – 2018-02-21 (×3): 5 mg via ORAL
  Filled 2018-02-19 (×3): qty 1

## 2018-02-19 MED ORDER — ACETAMINOPHEN 325 MG PO TABS
650.0000 mg | ORAL_TABLET | Freq: Four times a day (QID) | ORAL | Status: DC | PRN
  Administered 2018-02-21 (×2): 650 mg via ORAL
  Filled 2018-02-19 (×2): qty 2

## 2018-02-19 MED ORDER — SODIUM CHLORIDE 0.9 % IV SOLN
500.0000 mg | INTRAVENOUS | Status: DC
  Administered 2018-02-20: 500 mg via INTRAVENOUS
  Filled 2018-02-19: qty 500

## 2018-02-19 MED ORDER — FOLIC ACID 1 MG PO TABS
1.0000 mg | ORAL_TABLET | Freq: Every day | ORAL | Status: DC
  Administered 2018-02-20 – 2018-02-21 (×2): 1 mg via ORAL
  Filled 2018-02-19 (×3): qty 1

## 2018-02-19 MED ORDER — BENZONATATE 100 MG PO CAPS: 100.0000 mg | ORAL_CAPSULE | Freq: Three times a day (TID) | ORAL | Status: DC | PRN

## 2018-02-19 MED ORDER — SODIUM CHLORIDE 0.9 % IV BOLUS (SEPSIS)
1000.0000 mL | Freq: Once | INTRAVENOUS | Status: AC
  Administered 2018-02-19: 1000 mL via INTRAVENOUS

## 2018-02-19 MED ORDER — POTASSIUM CHLORIDE 10 MEQ/100ML IV SOLN
10.0000 meq | INTRAVENOUS | Status: AC
  Administered 2018-02-19 (×3): 10 meq via INTRAVENOUS
  Filled 2018-02-19 (×3): qty 100

## 2018-02-19 MED ORDER — ENSURE ENLIVE PO LIQD
237.0000 mL | Freq: Two times a day (BID) | ORAL | Status: DC
  Administered 2018-02-20 (×2): 237 mL via ORAL

## 2018-02-19 MED ORDER — SODIUM CHLORIDE 0.9 % IV SOLN
3.0000 g | Freq: Once | INTRAVENOUS | Status: AC
  Administered 2018-02-19: 3 g via INTRAVENOUS
  Filled 2018-02-19: qty 3

## 2018-02-19 MED ORDER — SODIUM CHLORIDE 0.9 % IV SOLN
500.0000 mg | Freq: Once | INTRAVENOUS | Status: AC
  Administered 2018-02-19: 500 mg via INTRAVENOUS
  Filled 2018-02-19: qty 500

## 2018-02-19 NOTE — Care Management (Signed)
EDCM reviewed CM consult. Patient is being admitted. Unit CM to follow for discharge needs. Syrenity Klepacki RN CCM  

## 2018-02-19 NOTE — ED Triage Notes (Signed)
Pt diagnosed with pna on Monday.  Antibiotics at home.  Not improving.  Coughing.  Shortness of breath.

## 2018-02-19 NOTE — ED Notes (Signed)
Date and time results received: Mar 14, 2018 3:25 PM  (use smartphrase ".now" to insert current time)  Test: Lactic acid Critical Value: 2.0  Name of Provider Notified: Alyssa PA  Orders Received? Or Actions Taken?: awaiting orders

## 2018-02-19 NOTE — Progress Notes (Signed)
ED TO INPATIENT HANDOFF REPORT  Name/Age/Gender Tricia Daniel 78 y.o. female  Code Status    Code Status Orders  (From admission, onward)         Start     Ordered   02/18/2018 1801  Do not attempt resuscitation (DNR)  Continuous    Question Answer Comment  In the event of cardiac or respiratory ARREST Do not call a "code blue"   In the event of cardiac or respiratory ARREST Do not perform Intubation, CPR, defibrillation or ACLS   In the event of cardiac or respiratory ARREST Use medication by any route, position, wound care, and other measures to relive pain and suffering. May use oxygen, suction and manual treatment of airway obstruction as needed for comfort.      02/17/2018 1805        Code Status History    This patient has a current code status but no historical code status.    Advance Directive Documentation     Most Recent Value  Type of Advance Directive  Healthcare Power of Attorney, Living will  Pre-existing out of facility DNR order (yellow form or pink MOST form)  -  "MOST" Form in Place?  -      Home/SNF/Other Home  Chief Complaint SOB; Fever  Level of Care/Admitting Diagnosis ED Disposition    ED Disposition Condition Comment   Admit  [facility][id number]  Level of Care: Telemetry [5]  Admit to tele based on following criteria: Complex arrhythmia (Bradycardia/Tachycardia)  Diagnosis: Sepsis due to pneumonia [facility](HCC) [[id number]]) [[id number]]  Admitting Physician: Jerald KiefHIU, STEPHEN K [[id number]]  Attending Physician: Jerald KiefHIU, STEPHEN K [[id number]]  Estimated length of stay: 3 - 4 days  Certification:: I certify this patient will need inpatient services for at least 2 midnights  PT Class (Do Not Modify): Inpatient [101]  PT Acc Code (Do Not Modify): Private [1]       Medical History Past Medical History:  Diagnosis Date  . Allergy   . Arthritis   . Chronic kidney disease   . Dementia (HCC)   . Depression   . Gait abnormality 04/04/2017  .  Hyperlipidemia   . Hypertension     Allergies Allergies  Allergen Reactions  . Latex Rash    If leave it on too long    IV Location/Drains/Wounds Patient Lines/Drains/Airways Status   Active Line/Drains/Airways    Name:   Placement date:   Placement time:   Site:   Days:   Peripheral IV 02/15/2018 Left Antecubital   02/22/2018    1609    Antecubital   less than 1   Peripheral IV 02/22/2018 Right Forearm   02/18/2018    1646    Forearm   less than 1          Labs/Imaging Results for orders placed or performed during the hospital encounter of 02/15/2018 (from the past 48 hour(s))  Comprehensive metabolic panel     Status: Abnormal   Collection Time: 03/03/2018  2:12 PM  Result Value Ref Range   Sodium 139 135 - 145 mmol/L   Potassium 2.8 (L) 3.5 - 5.1 mmol/L   Chloride 110 98 - 111 mmol/L   CO2 20 (L) 22 - 32 mmol/L   Glucose, Bld 170 (H) 70 - 99 mg/dL   BUN 23 8 - 23 mg/dL   Creatinine, Ser 4.540.82 0.44 - 1.00 mg/dL   Calcium 8.2 (L) 8.9 - 10.3 mg/dL   Total Protein 7.1 6.5 -  8.1 g/dL   Albumin 2.9 (L) 3.5 - 5.0 g/dL   AST 33 15 - 41 U/L   ALT 15 0 - 44 U/L   Alkaline Phosphatase 72 38 - 126 U/L   Total Bilirubin 0.5 0.3 - 1.2 mg/dL   GFR calc non Af Amer >60 >60 mL/min   GFR calc Af Amer >60 >60 mL/min   Anion gap 9 5 - 15    Comment: Performed at [facility], [address] [place], [place] [address]  CBC with Differential     Status: Abnormal   Collection Time: 02/21/2018  2:12 PM  Result Value Ref Range   WBC 12.2 (H) 4.0 - 10.5 K/uL   RBC 4.15 3.87 - 5.11 MIL/uL   Hemoglobin 12.1 12.0 - 15.0 g/dL   HCT 00.4 59.9 - 77.4 %   MCV 96.4 80.0 - 100.0 fL   MCH 29.2 26.0 - 34.0 pg   MCHC 30.3 30.0 - 36.0 g/dL   RDW 14.2 (H) 39.5 - 32.0 %   Platelets 359 150 - 400 K/uL   nRBC 0.0 0.0 - 0.2 %   Neutrophils Relative % 76 %   Neutro Abs 9.2 (H) 1.7 - 7.7 K/uL   Lymphocytes Relative 11 %   Lymphs Abs 1.3 0.7 - 4.0 K/uL   Monocytes Relative 10 %   Monocytes  Absolute 1.3 (H) 0.1 - 1.0 K/uL   Eosinophils Relative 2 %   Eosinophils Absolute 0.3 0.0 - 0.5 K/uL   Basophils Relative 0 %   Basophils Absolute 0.0 0.0 - 0.1 K/uL   Immature Granulocytes 1 %   Abs Immature Granulocytes 0.12 (H) 0.00 - 0.07 K/uL    Comment: Performed at [facility], [address], [place], [place] [address]  Protime-INR     Status: None   Collection Time: 02/14/2018  2:12 PM  Result Value Ref Range   Prothrombin Time 14.1 11.4 - 15.2 seconds   INR 1.10     Comment: Performed at [facility], [address], [place], [place] [address]  Magnesium     Status: None   Collection Time: 02/23/2018  2:12 PM  Result Value Ref Range   Magnesium 1.9 1.7 - 2.4 mg/dL    Comment: Performed at [facility], [address], [place], [place] [address]  Lactic acid, plasma     Status: Abnormal   Collection Time: February 25, 2018  2:13 PM  Result Value Ref Range   Lactic Acid, Venous 2.0 (HH) 0.5 - 1.9 mmol/L    Comment: CRITICAL RESULT CALLED TO, READ BACK BY AND VERIFIED WITH: M.BRILL AT 1523 ON 02/22/2018 BY N.THOMPSON Performed at [facility], [address] [place], [place] [address]   POCT i-Stat troponin I     Status: None   Collection Time: Feb 25, 2018  4:44 PM  Result Value Ref Range   Troponin i, poc 0.02 0.00 - 0.08 ng/mL   Comment 3            Comment: Due to the release kinetics of cTnI, a negative result within the first hours of the onset of symptoms does not rule out myocardial infarction with certainty. If myocardial infarction is still suspected, repeat the test at appropriate intervals.    Dg Chest 2 View  Result Date: 02/16/2018 CLINICAL DATA:  Pt diagnosed with pna on Monday. Antibiotics at home. Not improving. Coughing. Shortness of breath. Hx HTN, CKD. EXAM: CHEST - 2 VIEW  COMPARISON:  02/16/2018 FINDINGS: Heart size is accentuated by AP technique. Increased airspace filling opacities are  identified in the RIGHT UPPER lobe and LEFT perihilar region compared to prior study. Chronic perihilar peribronchial thickening. No pulmonary edema. Convex RIGHT scoliosis of the thoracic spine. IMPRESSION: Increased bilateral infiltrates. Electronically Signed   By: Norva PavlovElizabeth  Brown M.D.   On: 02/13/2018 14:29    Pending Labs Unresulted Labs (From admission, onward)    Start     Ordered   02/26/18 0500  Creatinine, serum  (enoxaparin (LOVENOX)    CrCl >/= 30 ml/min)  Weekly,   R    Comments:  while on enoxaparin therapy    02/07/2018 1805   02/20/18 0500  Comprehensive metabolic panel  Tomorrow morning,   R     03/03/2018 1805   02/20/18 0500  CBC  Tomorrow morning,   R     02/09/2018 1805   02/14/2018 1803  HIV antibody (Routine Screening)  Once,   R     02/11/2018 1805   02/11/2018 1803  Culture, blood (routine x 2) Call MD if unable to obtain prior to antibiotics being given  BLOOD CULTURE X 2,   R    Comments:  If blood cultures drawn in Emergency Department - Do not draw and cancel order    02/18/2018 1805   03/01/2018 1803  Culture, sputum-assessment  Once,   R     03/04/2018 1805   02/11/2018 1803  Gram stain  Once,   R     02/09/2018 1805   02/22/2018 1803  Strep pneumoniae urinary antigen  Once,   R     02/24/2018 1805   02/23/2018 1800  CBC  (enoxaparin (LOVENOX)    CrCl >/= 30 ml/min)  Once,   R    Comments:  Baseline for enoxaparin therapy IF NOT ALREADY DRAWN.  Notify MD if PLT < 100 K.    02/08/2018 1805   02/17/2018 1800  Creatinine, serum  (enoxaparin (LOVENOX)    CrCl >/= 30 ml/min)  Once,   R    Comments:  Baseline for enoxaparin therapy IF NOT ALREADY DRAWN.    02/27/2018 1805   02/23/2018 1624  Urine culture  ONCE - STAT,   STAT     03/04/2018 1623   02/17/2018 1538  Urinalysis, Routine w reflex microscopic  ONCE - STAT,   STAT     02/18/2018 1547   02/08/2018 1357  Lactic acid, plasma  Now then every 2 hours,   STAT     02/12/2018 1356   03/01/2018 1357  Culture, blood (Routine x 2)  BLOOD CULTURE X  2,   STAT     03/03/2018 1356   02/10/2018 1357  Urinalysis, Routine w reflex microscopic  ONCE - STAT,   STAT     02/26/2018 1356          Vitals/Pain Today's Vitals   02/15/2018 1713 02/12/2018 1730 02/18/2018 1800 02/07/2018 1830  BP: (!) 142/89 115/65 (!) 111/58 (!) 105/55  Pulse: 96 95 98 99  Resp: (!) 28 (!) 24 (!) 22 (!) 23  Temp:      TempSrc:      SpO2: 93% 96% 95% 97%  PainSc:        Isolation Precautions No active isolations  Medications Medications  sodium chloride flush (NS) 0.9 % injection 3 mL (has no administration in time range)  potassium chloride 10 mEq in 100 mL IVPB (10 mEq Intravenous New Bag/Given 02/10/2018 1805)  azithromycin (ZITHROMAX) 500 mg in sodium chloride 0.9 % 250 mL IVPB (500 mg Intravenous New Bag/Given 03/03/2018 1757)  enoxaparin (LOVENOX) injection 40 mg (has no administration in time range)  acetaminophen (TYLENOL) tablet 650 mg (has no administration in time range)    Or  acetaminophen (TYLENOL) suppository 650 mg (has no administration in time range)  azithromycin (ZITHROMAX) 250 mg in dextrose 5 % 125 mL IVPB (has no administration in time range)  haloperidol lactate (HALDOL) injection 2 mg (has no administration in time range)  amLODipine (NORVASC) tablet 5 mg (has no administration in time range)  benzonatate (TESSALON) capsule 100 mg (has no administration in time range)  donepezil (ARICEPT) tablet 5 mg (has no administration in time range)  FLUoxetine (PROZAC) capsule 10 mg (has no administration in time range)  FLUoxetine (PROZAC) capsule 40 mg (has no administration in time range)  folic acid (FOLVITE) tablet 1 mg (has no administration in time range)  lactulose (CHRONULAC) 10 GM/15ML solution 10 g (has no administration in time range)  predniSONE (DELTASONE) tablet 5 mg (has no administration in time range)  QUEtiapine (SEROQUEL) tablet 25 mg (has no administration in time range)  simvastatin (ZOCOR) tablet 20 mg (has no administration in time  range)  feeding supplement (ENSURE ENLIVE) (ENSURE ENLIVE) liquid 237 mL (has no administration in time range)  Ampicillin-Sulbactam (UNASYN) 3 g in sodium chloride 0.9 % 100 mL IVPB (has no administration in time range)  sodium chloride 0.9 % bolus 1,000 mL (0 mLs Intravenous Stopped 03-Mar-2018 1659)  Ampicillin-Sulbactam (UNASYN) 3 g in sodium chloride 0.9 % 100 mL IVPB (0 g Intravenous Stopped 03-03-2018 1717)  ipratropium-albuterol (DUONEB) 0.5-2.5 (3) MG/3ML nebulizer solution 3 mL (3 mLs Nebulization Given 03-Mar-2018 1723)    Mobility walks with device

## 2018-02-19 NOTE — ED Notes (Signed)
Report called to receiving RN.

## 2018-02-19 NOTE — ED Triage Notes (Signed)
Pt placed on 2l .  sats 95%

## 2018-02-19 NOTE — H&P (Signed)
History and Physical    Tricia DecampMiriam Daniel ZOX:[id number] DOB: 05/12/1940 DOA: August 14, 2018  PCP: Tricia Daniel, Tricia Lum, NP  Patient coming from: Home  Chief Complaint: SOB  HPI: Tricia DecampMiriam Daniel is a 78 y.o. female with medical history significant of dementia with sundowning, arthritis on chronic immunosuppressive tx, HLD, HTN, presents to ED with worsening sob and productive cough. Several weeks prior to admit, patient noted to have concerns of swallowing secretions and questionable aspiration. Pt is supposed to follow up with SLP eval this week. Patient was seen 3 days prior to visit where she was diagnosed with PNA on CXR and started on Levaquin. Symptoms worsened with increased sob, thus patient presented to ED for further evaluation  ED Course: In the ED, patient noted to have O2 sats in the 80's on room air, improved with 2LNC. CXR was performed with findings worrisome for worsening PNA despite recent abx tx. Patient was noted to have WBC of 12.2, K of 2.8, lactate of 2.0. Patient was given IVF bolus and started on empiric azithromycin and unasyn. Hospitalist consulted for consideration for admission.  Review of Systems:  Review of Systems  Unable to perform ROS: Dementia    Past Medical History:  Diagnosis Date  . Allergy   . Arthritis   . Chronic kidney disease   . Dementia (HCC)   . Depression   . Gait abnormality 04/04/2017  . Hyperlipidemia   . Hypertension     Past Surgical History:  Procedure Laterality Date  . COLOSTOMY  1988  . COLOSTOMY REVERSAL  1999  . nephrectomy Left 1988  . TONSILLECTOMY       reports that she has never smoked. She has never used smokeless tobacco. She reports that she does not drink alcohol or use drugs.  Allergies  Allergen Reactions  . Latex Rash    If leave it on too long    Family History  Problem Relation Age of Onset  . Arthritis Mother   . Hearing loss Mother   . Heart attack Father   . Heart disease Father   . Heart Problems Father    . Arthritis Brother   . Arthritis Daughter   . Asthma Daughter   . Depression Daughter   . Heart disease Daughter   . Hypertension Daughter   . Diabetes Maternal Grandmother   . Heart attack Maternal Grandmother   . Arthritis Daughter   . Depression Daughter   . Hyperlipidemia Daughter     Prior to Admission medications   Medication Sig Start Date End Date Taking? Authorizing Provider  amLODipine (NORVASC) 10 MG tablet Take 0.5 tablets (5 mg total) by mouth daily. 01/15/18  Yes Nche, Tricia Gainsharlotte Lum, NP  benzonatate (TESSALON) 100 MG capsule Take 1 capsule (100 mg total) by mouth 3 (three) times daily as needed for cough. 02/16/18  Yes Nche, Tricia Gainsharlotte Lum, NP  Calcium Carbonate-Vitamin D (CALCIUM 600+D HIGH POTENCY) 600-400 MG-UNIT tablet Take 1 tablet by mouth daily.   Yes [provider]  clotrimazole-betamethasone (LOTRISONE) cream Apply 1 application topically 2 (two) times daily. Patient taking differently: Apply 1 application topically 2 (two) times daily as needed (rash).  03/20/17  Yes Nche, Tricia Gainsharlotte Lum, NP  diclofenac sodium (VOLTAREN) 1 % GEL Apply 2 g topically 2 (two) times daily as needed (arthritis pain).  01/06/15  Yes [provider]  donepezil (ARICEPT) 5 MG tablet Take 1 tablet (5 mg total) by mouth at bedtime. 01/19/18  Yes York SpanielWillis, Charles K, MD  etanercept (ENBREL  SURECLICK) 50 MG/ML injection Inject 50 mg into the skin once a week. Sunday 08/29/16  Yes [provider]  FLUoxetine (PROZAC) 10 MG capsule TAKE ONE CAPSULE BY MOUTH daily. Takes total of 50mg  once a day Patient taking differently: Take 10 mg by mouth. Takes total of 50mg  once a day--10mg  is the bedtime dose. 09/10/17  Yes Nche, Tricia Gains, NP  FLUoxetine (PROZAC) 40 MG capsule Take 1 capsule (40 mg total) by mouth daily. Takes total of 50mg  daily Patient taking differently: Take 40 mg by mouth daily. Takes total of 50mg  daily-40mg  is the morning dose. 09/10/17  Yes Nche, Tricia Gains,  NP  folic acid (FOLVITE) 1 MG tablet Take 1 mg by mouth daily. 03/08/17  Yes [provider]  guaiFENesin-dextromethorphan (ROBITUSSIN DM) 100-10 MG/5ML syrup Take 5 mLs by mouth every 4 (four) hours as needed for cough. 02/16/18  Yes Nche, Tricia Gains, NP  ibuprofen (ADVIL,MOTRIN) 200 MG tablet Take 200-400 mg by mouth every 6 (six) hours as needed for headache or moderate pain.   Yes [provider]  ipratropium (ATROVENT) 0.03 % nasal spray Place 1 spray into both nostrils 2 (two) times daily. Do not use for more than 5days. Patient taking differently: Place 1 spray into both nostrils 2 (two) times daily as needed for rhinitis. Do not use for more than 5days. 12/09/17  Yes Nche, Tricia Gains, NP  lactulose (CHRONULAC) 10 GM/15ML solution Take 15 mLs (10 g total) by mouth 2 (two) times daily as needed for moderate constipation. 01/15/18  Yes Nche, Tricia Gains, NP  levofloxacin (LEVAQUIN) 500 MG tablet Take 1 tablet (500 mg total) by mouth daily. 02/16/18  Yes Nche, Tricia Gains, NP  methotrexate (RHEUMATREX) 2.5 MG tablet Take 12.5 mg by mouth once a week. Pt takes five 2.5mg  tablets for a total dose of 12.5mg  every Thursday for rheumatoid arthritis.   Yes [provider]  miconazole (MICOTIN) 2 % powder Apply topically 2 (two) times daily as needed for itching. 03/20/17  Yes Nche, Tricia Gains, NP  Multiple Vitamins-Minerals (MULTIVITAMIN WOMEN 50+ PO) Take 1 tablet by mouth daily.   Yes [provider]  Nutritional Supplements (ENSURE CLEAR) LIQD Take 1 Bottle by mouth 2 (two) times daily. Apple or mixed berry flavor   Yes [provider]  Nutritional Supplements (ENSURE ENLIVE PO) Take 1 Bottle by mouth 3 (three) times daily. Vanilla or strawberry flavors   Yes [provider]  predniSONE (DELTASONE) 5 MG tablet Take 5 mg by mouth daily with breakfast. Pt takes 5mg  daily. 03/07/17  Yes [provider]  QUEtiapine (SEROQUEL) 25 MG tablet  Take 1 tablet (25 mg total) by mouth at bedtime. 02/03/18  Yes York Spaniel, MD  simvastatin (ZOCOR) 20 MG tablet TAKE ONE TABLET BY MOUTH nighly Patient taking differently: Take 20 mg by mouth every evening.  01/05/18  Yes Tricia Ng, NP    Physical Exam: Vitals:   March 18, 2018 1630 2018/03/18 1636 03/18/2018 1700 Mar 18, 2018 1713  BP:    (!) 142/89  Pulse: 98  87 96  Resp: (!) 23  20 (!) 28  Temp:  99.2 F (37.3 C)    TempSrc:  Rectal    SpO2: 95%  98% 93%    Constitutional: NAD, calm, comfortable Vitals:   03/18/2018 1630 03/18/2018 1636 03/18/2018 1700 03-18-2018 1713  BP:    (!) 142/89  Pulse: 98  87 96  Resp: (!) 23  20 (!) 28  Temp:  99.2  F (37.3 C)    TempSrc:  Rectal    SpO2: 95%  98% 93%   Eyes: PERRL, lids and conjunctivae normal ENMT: Mucous membranes are moist. Posterior pharynx clear of any exudate or lesions.Normal dentition.  Neck: normal, supple, no masses, no thyromegaly Respiratory: clear to auscultation bilaterally, no wheezing, no crackles. Normal respiratory effort. No accessory muscle use.  Cardiovascular: Regular rate and rhythm, s1, s2 Abdomen: no tenderness, no masses palpated. No hepatosplenomegaly. Bowel sounds positive.  Musculoskeletal: no clubbing / cyanosis. No joint deformity upper and lower extremities. Good ROM, no contractures. Normal muscle tone.  Skin: no rashes, lesions, ulcers. No induration Neurologic: CN 2-12 grossly intact. Sensation intact, DTR normal. Strength 5/5 in all 4.  Psychiatric: Unable to assess given underlying dementia   Labs on Admission: I have personally reviewed following labs and imaging studies  CBC: Recent Labs  Lab 01-23-18 1412  WBC 12.2*  NEUTROABS 9.2*  HGB 12.1  HCT 40.0  MCV 96.4  PLT 359   Basic Metabolic Panel: Recent Labs  Lab 01-23-18 1412  NA 139  K 2.8*  CL 110  CO2 20*  GLUCOSE 170*  BUN 23  CREATININE 0.82  CALCIUM 8.2*  MG 1.9   GFR: Estimated Creatinine Clearance: 44.7 mL/min  (by C-G formula based on SCr of 0.82 mg/dL). Liver Function Tests: Recent Labs  Lab 01-23-18 1412  AST 33  ALT 15  ALKPHOS 72  BILITOT 0.5  PROT 7.1  ALBUMIN 2.9*   No results for input(s): LIPASE, AMYLASE in the last 168 hours. No results for input(s): AMMONIA in the last 168 hours. Coagulation Profile: Recent Labs  Lab 01-23-18 1412  INR 1.10   Cardiac Enzymes: No results for input(s): CKTOTAL, CKMB, CKMBINDEX, TROPONINI in the last 168 hours. BNP (last 3 results) No results for input(s): PROBNP in the last 8760 hours. HbA1C: No results for input(s): HGBA1C in the last 72 hours. CBG: No results for input(s): GLUCAP in the last 168 hours. Lipid Profile: No results for input(s): CHOL, HDL, LDLCALC, TRIG, CHOLHDL, LDLDIRECT in the last 72 hours. Thyroid Function Tests: No results for input(s): TSH, T4TOTAL, FREET4, T3FREE, THYROIDAB in the last 72 hours. Anemia Panel: No results for input(s): VITAMINB12, FOLATE, FERRITIN, TIBC, IRON, RETICCTPCT in the last 72 hours. Urine analysis:    Component Value Date/Time   COLORURINE YELLOW 02/11/2018 1529   APPEARANCEUR CLOUDY (A) 02/11/2018 1529   LABSPEC 1.015 02/11/2018 1529   PHURINE < OR = 5.0 02/11/2018 1529   GLUCOSEU NEGATIVE 02/11/2018 1529   HGBUR 2+ (A) 02/11/2018 1529   BILIRUBINUR NEGATIVE 02/06/2018 1356   BILIRUBINUR neg 09/09/2017 1153   KETONESUR NEGATIVE 02/11/2018 1529   PROTEINUR NEGATIVE 02/11/2018 1529   UROBILINOGEN 0.2 09/09/2017 1153   NITRITE NEGATIVE 02/06/2018 1356   LEUKOCYTESUR MODERATE (A) 02/06/2018 1356   Sepsis Labs: !!!!!!!!!!!!!!!!!!!!!!!!!!!!!!!!!!!!!!!!!!!! @LABRCNTIP (procalcitonin:4,lacticidven:4) ) Recent Results (from the past 240 hour(s))  Urine Culture     Status: None   Collection Time: 02/11/18  3:29 PM  Result Value Ref Range Status   MICRO NUMBER: [id number]  Final   SPECIMEN QUALITY: Adequate  Final   Sample Source URINE  Final   STATUS: FINAL  Final   Result:   Final     Single organism less than 10,000 CFU/mL isolated. These organisms, commonly found on external and internal genitalia, are considered colonizers. No further testing performed.     Radiological Exams on Admission: Dg Chest 2 View  Result Date: 01/13/18 CLINICAL DATA:  Pt diagnosed with pna on Monday. Antibiotics at home. Not improving. Coughing. Shortness of breath. Hx HTN, CKD. EXAM: CHEST - 2 VIEW COMPARISON:  02/16/2018 FINDINGS: Heart size is accentuated by AP technique. Increased airspace filling opacities are identified in the RIGHT UPPER lobe and LEFT perihilar region compared to prior study. Chronic perihilar peribronchial thickening. No pulmonary edema. Convex RIGHT scoliosis of the thoracic spine. IMPRESSION: Increased bilateral infiltrates. Electronically Signed   By: Norva Pavlov M.D.   On: 03/06/2018 14:29    EKG: Independently reviewed. Sinus tach, QTc 432  Assessment/Plan Principal Problem:   Sepsis due to pneumonia [facility]) Active Problems:   Essential hypertension   Mixed hyperlipidemia   Dementia with behavioral disturbance (HCC)   1. Sepsis secondary to PNA 1. Presents with leukocytosis, tachycardia, elevated lactate with worsening PNA on personal review of CXR 2. Will continue on azithromycin with unasyn for aspiration coverage 3. Follow pan cultures 4. Currently stable on 2LNC 5. Given concerns of aspiration, will consult SLP 6. Wean O2 as tolerated 2. HTN 1. BP stable at this time 2. Cont home meds as tolerated 3. HLD 1. Stable 2. Cont home meds for now 4. Dementia 1. At present stable 2. Per family, patient known to have marked sun-downing 3. Will continue home anti-psychotics  4. Will add PRN IV haldol. QTc reviewed, unremarkable 5. Acute hypoxemic respiratory failure 1. Now stable on 2LNC 2. Per above, wean O2 as tolerated  DVT prophylaxis: Lovenox subQ  Code Status: DNR, confirmed wishes with daughter (POA) at bedside Family Communication: Pt in  room, family at bedside  Disposition Plan: Uncertain at this time  Consults called:  Admission status: Inpatient as wouldl likely require greater than 2 midnight stay for IV antibiotic and O2 weaning   Rickey Barbara MD [facility] Pager On Amion  If 7PM-7AM, please contact night-coverage  02/23/2018, 6:11 PM

## 2018-02-19 NOTE — ED Provider Notes (Addendum)
[facility]-EMERGENCY DEPT Provider Note   CSN: [id number] Arrival date & time: 03-Mar-2018  1342     History   Chief Complaint Chief Complaint  Patient presents with  . Fever  . Shortness of Breath    HPI Tricia Daniel is a 78 y.o. female.  HPI  Patient is a 78 year old female with a history of hypertension, hyperlipidemia, CKD, rheumatoid arthritis, psoriatic arthritis, dementia presenting for fever, cough, and shortness of breath.  Patient presents with her daughter who assist in history taking.  Patient comes from home and lives with her daughter and son-in-law.  According to family, patient began having a wet sounding cough nonproductive 6 days ago.  She saw her primary care provider on 02-16-2018 who diagnosed her with right lower lobe pneumonia on chest x-ray and prescribed Levaquin.  She takes methotrexate and Enbrel, however has not had these medications in 2 weeks due to recent urinary tract infection followed by pneumonia.  Family reports that fever is increasing and was 101 this morning.  She received Tylenol.  Report that she still is unable to cough up sputum.  Family reports low appetite.  They do state that she has had some aspiration episodes within the last couple weeks.  Patient had negative flu A and B testing this week when she presented to her primary care provider.  Past Medical History:  Diagnosis Date  . Allergy   . Arthritis   . Chronic kidney disease   . Dementia (HCC)   . Depression   . Gait abnormality 04/04/2017  . Hyperlipidemia   . Hypertension     Patient Active Problem List   Diagnosis Date Noted  . Altered mental status 02/06/2018  . Hallucinations 12/09/2017  . Weight loss, unintentional 09/09/2017  . Gait abnormality 04/04/2017  . Mixed hyperlipidemia 03/21/2017  . Polycystic kidney disease 03/20/2017  . Essential hypertension 03/20/2017  . Asymptomatic postmenopausal estrogen deficiency 03/20/2017  . Bilateral hearing  loss 03/20/2017  . Depression, major, single episode, complete remission (HCC) 03/20/2017  . Memory deficit 03/20/2017  . H/O unilateral nephrectomy 03/20/2017  . S/P partial colectomy 03/20/2017  . Chronic cystitis 06/27/2013  . Congenital polycystic kidney 07/07/2012  . Acquired absence of kidney 07/07/2012    Past Surgical History:  Procedure Laterality Date  . COLOSTOMY  1988  . COLOSTOMY REVERSAL  1999  . nephrectomy Left 1988  . TONSILLECTOMY       OB History   No obstetric history on file.      Home Medications    Prior to Admission medications   Medication Sig Start Date End Date Taking? Authorizing Provider  amLODipine (NORVASC) 10 MG tablet Take 0.5 tablets (5 mg total) by mouth daily. 01/15/18   Nche, Bonna Gains, NP  benzonatate (TESSALON) 100 MG capsule Take 1 capsule (100 mg total) by mouth 3 (three) times daily as needed for cough. 02/16/18   Nche, Bonna Gains, NP  Calcium Carbonate-Vitamin D (CALCIUM 600+D HIGH POTENCY) 600-400 MG-UNIT tablet Take 1 tablet by mouth daily.    [provider]  clotrimazole-betamethasone (LOTRISONE) cream Apply 1 application topically 2 (two) times daily. Patient taking differently: Apply 1 application topically 2 (two) times daily as needed (rash).  03/20/17   Nche, Bonna Gains, NP  diclofenac sodium (VOLTAREN) 1 % GEL Apply 2 g topically 2 (two) times daily as needed (arthritis pain).  01/06/15   [provider]  donepezil (ARICEPT) 5 MG tablet Take 1 tablet (5 mg total) by mouth  at bedtime. 01/19/18   York SpanielWillis, Charles K, MD  ENSURE PLUS (ENSURE PLUS) LIQD Take 237 mLs by mouth 2 (two) times daily between meals.    [provider]  etanercept (ENBREL SURECLICK) 50 MG/ML injection Inject 50 mg into the skin once a week. Sunday 08/29/16   [provider]  FLUoxetine (PROZAC) 10 MG capsule TAKE ONE CAPSULE BY MOUTH daily. Takes total of 50mg  once a day 09/10/17   Nche, Bonna Gainsharlotte Lum, NP  FLUoxetine  (PROZAC) 40 MG capsule Take 1 capsule (40 mg total) by mouth daily. Takes total of 50mg  daily 09/10/17   Nche, Bonna Gainsharlotte Lum, NP  folic acid (FOLVITE) 1 MG tablet Take 1 mg by mouth daily. 03/08/17   [provider]  guaiFENesin-dextromethorphan (ROBITUSSIN DM) 100-10 MG/5ML syrup Take 5 mLs by mouth every 4 (four) hours as needed for cough. 02/16/18   Nche, Bonna Gainsharlotte Lum, NP  ibuprofen (ADVIL,MOTRIN) 200 MG tablet Take 200-400 mg by mouth every 6 (six) hours as needed for headache or moderate pain.    [provider]  ipratropium (ATROVENT) 0.03 % nasal spray Place 1 spray into both nostrils 2 (two) times daily. Do not use for more than 5days. Patient taking differently: Place 1 spray into both nostrils 2 (two) times daily as needed for rhinitis. Do not use for more than 5days. 12/09/17   Nche, Bonna Gainsharlotte Lum, NP  lactulose (CHRONULAC) 10 GM/15ML solution Take 15 mLs (10 g total) by mouth 2 (two) times daily as needed for moderate constipation. 01/15/18   Nche, Bonna Gainsharlotte Lum, NP  levofloxacin (LEVAQUIN) 500 MG tablet Take 1 tablet (500 mg total) by mouth daily. 02/16/18   Nche, Bonna Gainsharlotte Lum, NP  methotrexate (RHEUMATREX) 2.5 MG tablet Take 12.5 mg by mouth once a week. 5 tablets every Thursday    [provider]  miconazole (MICOTIN) 2 % powder Apply topically 2 (two) times daily as needed for itching. 03/20/17   Nche, Bonna Gainsharlotte Lum, NP  Multiple Vitamins-Minerals (MULTIVITAMIN WOMEN 50+ PO) Take 1 tablet by mouth daily.    [provider]  predniSONE (DELTASONE) 5 MG tablet Take 5 mg by mouth daily with breakfast.  03/07/17   [provider]  QUEtiapine (SEROQUEL) 25 MG tablet Take 1 tablet (25 mg total) by mouth at bedtime. 02/03/18   York SpanielWillis, Charles K, MD  simvastatin (ZOCOR) 20 MG tablet TAKE ONE TABLET BY MOUTH nighly 01/05/18   Nche, Bonna Gainsharlotte Lum, NP    Family History Family History  Problem Relation Age of Onset  . Arthritis Mother   . Hearing loss Mother    . Heart attack Father   . Heart disease Father   . Heart Problems Father   . Arthritis Brother   . Arthritis Daughter   . Asthma Daughter   . Depression Daughter   . Heart disease Daughter   . Hypertension Daughter   . Diabetes Maternal Grandmother   . Heart attack Maternal Grandmother   . Arthritis Daughter   . Depression Daughter   . Hyperlipidemia Daughter     Social History Social History   Tobacco Use  . Smoking status: Never Smoker  . Smokeless tobacco: Never Used  Substance Use Topics  . Alcohol use: No    Frequency: Never  . Drug use: No     Allergies   Latex   Review of Systems Review of Systems  Constitutional: Negative for chills and fever.  HENT: Negative for congestion and sore throat.   Eyes: Negative for visual  disturbance.  Respiratory: Positive for cough. Negative for chest tightness and shortness of breath.   Cardiovascular: Negative for chest pain, palpitations and leg swelling.  Gastrointestinal: Negative for abdominal pain, nausea and vomiting.  Genitourinary: Negative for dysuria and flank pain.  Musculoskeletal: Negative for back pain and myalgias.  Skin: Negative for rash.  Neurological: Negative for dizziness and syncope.     Physical Exam Updated Vital Signs BP 136/86 (BP Location: Left Arm)   Pulse (!) 122   Temp 99.5 F (37.5 C) (Oral)   Resp (!) 22   SpO2 (!) 88%   Physical Exam Vitals signs and nursing note reviewed.  Constitutional:      Comments: Chronically ill-appearing.  HENT:     Head: Normocephalic and atraumatic.  Eyes:     Conjunctiva/sclera: Conjunctivae normal.     Pupils: Pupils are equal, round, and reactive to light.  Neck:     Musculoskeletal: Normal range of motion and neck supple.  Cardiovascular:     Rate and Rhythm: Regular rhythm. Tachycardia present.     Heart sounds: S1 normal and S2 normal. No murmur.     Comments: No LE edema. Pulmonary:     Effort: Pulmonary effort is normal. Tachypnea  present.     Breath sounds: Examination of the right-middle field reveals rales. Examination of the left-middle field reveals rales. Examination of the right-lower field reveals wheezing and rales. Examination of the left-lower field reveals wheezing and rales. Wheezing and rales present.  Abdominal:     General: There is no distension.     Palpations: Abdomen is soft.     Tenderness: There is no abdominal tenderness. There is no guarding.  Musculoskeletal: Normal range of motion.        General: No deformity.  Lymphadenopathy:     Cervical: No cervical adenopathy.  Skin:    General: Skin is warm and dry.     Findings: No erythema or rash.  Neurological:     Mental Status: She is alert.     Comments: Cranial nerves grossly intact. Patient moves extremities symmetrically and with good coordination.  Psychiatric:        Behavior: Behavior normal.        Thought Content: Thought content normal.        Judgment: Judgment normal.      ED Treatments / Results  Labs (all labs ordered are listed, but only abnormal results are displayed) Labs Reviewed  COMPREHENSIVE METABOLIC PANEL - Abnormal; Notable for the following components:      Result Value   Potassium 2.8 (*)    CO2 20 (*)    Glucose, Bld 170 (*)    Calcium 8.2 (*)    Albumin 2.9 (*)    All other components within normal limits  LACTIC ACID, PLASMA - Abnormal; Notable for the following components:   Lactic Acid, Venous 2.0 (*)    All other components within normal limits  CBC WITH DIFFERENTIAL/PLATELET - Abnormal; Notable for the following components:   WBC 12.2 (*)    RDW 16.8 (*)    Neutro Abs 9.2 (*)    Monocytes Absolute 1.3 (*)    Abs Immature Granulocytes 0.12 (*)    All other components within normal limits  CULTURE, BLOOD (ROUTINE X 2)  CULTURE, BLOOD (ROUTINE X 2)  PROTIME-INR  LACTIC ACID, PLASMA  URINALYSIS, ROUTINE W REFLEX MICROSCOPIC  URINALYSIS, ROUTINE W REFLEX MICROSCOPIC  MAGNESIUM  I-STAT  TROPONIN, ED    EKG None  ED ECG REPORT   Date: 02/07/2018  Rate: 108   Rhythm: sinus tachycardia  QRS Axis: normal  Intervals: normal  ST/T Wave abnormalities: normal and nonspecific T wave changes  Conduction Disutrbances:none  Narrative Interpretation:   I have personally reviewed the EKG tracing and agree with the computerized printout as noted. Confirmed by Dr. Arby Barrette.    Radiology Dg Chest 2 View  Result Date: 03/03/2018 CLINICAL DATA:  Pt diagnosed with pna on Monday. Antibiotics at home. Not improving. Coughing. Shortness of breath. Hx HTN, CKD. EXAM: CHEST - 2 VIEW COMPARISON:  02/16/2018 FINDINGS: Heart size is accentuated by AP technique. Increased airspace filling opacities are identified in the RIGHT UPPER lobe and LEFT perihilar region compared to prior study. Chronic perihilar peribronchial thickening. No pulmonary edema. Convex RIGHT scoliosis of the thoracic spine. IMPRESSION: Increased bilateral infiltrates. Electronically Signed   By: Norva Pavlov M.D.   On: 02/21/2018 14:29    Procedures Procedures (including critical care time)  CRITICAL CARE Performed by: Elisha Ponder   Total critical care time: 35 minutes  Critical care time was exclusive of separately billable procedures and treating other patients.  Critical care was necessary to treat or prevent imminent or life-threatening deterioration.  Critical care was time spent personally by me on the following activities: development of treatment plan with patient and/or surrogate as well as nursing, discussions with consultants, evaluation of patient's response to treatment, examination of patient, obtaining history from patient or surrogate, ordering and performing treatments and interventions, ordering and review of laboratory studies, ordering and review of radiographic studies, pulse oximetry and re-evaluation of patient's condition.   Medications Ordered in ED Medications  sodium  chloride flush (NS) 0.9 % injection 3 mL (has no administration in time range)  sodium chloride 0.9 % bolus 1,000 mL (has no administration in time range)  potassium chloride 10 mEq in 100 mL IVPB (has no administration in time range)  Ampicillin-Sulbactam (UNASYN) 3 g in sodium chloride 0.9 % 100 mL IVPB (has no administration in time range)  ipratropium-albuterol (DUONEB) 0.5-2.5 (3) MG/3ML nebulizer solution 3 mL (has no administration in time range)  azithromycin (ZITHROMAX) 500 mg in sodium chloride 0.9 % 250 mL IVPB (has no administration in time range)     Initial Impression / Assessment and Plan / ED Course  I have reviewed the triage vital signs and the nursing notes.  Pertinent labs & imaging results that were available during my care of the patient were reviewed by me and considered in my medical decision making (see chart for details).  Clinical Course as of Feb 20 1716  Thu Feb 19, 2018  1700 Spoke with Dr. Rhona Leavens of hospital medicine who will evaluate patient. Appreciate his involvement in the care of this patient.    [AM]    Clinical Course User Index [AM] Elisha Ponder, PA-C    Patient is critically ill and requiring a higher level of care. Sepsis suspected. Code sepsis called. Patient meeting SIRS criteria based on tachycardia, subjective fever, respiratory rate. See vitals below. Suspected source of infection pumonary based on exam and history of infiltrate 4 days ago. Lactic acid 2.0. Signs of end organ dysfunction include lactic acidosis, tachycardia. Urine is pending.   Vitals:   03/03/2018 1358  BP: 136/86  Pulse: (!) 122  Resp: (!) 22  Temp: 99.5 F (37.5 C)  TempSrc: Oral  SpO2: (!) 88%     Broad spectrum antibiotics initiated based on suspected source of  infection.  Patient does not have hypotension, lactic acid not greater than 4.  Will initiate 1 L of normal saline bolus followed by maintenance fluids.  Case discussed with pharmacy.  Patient does have  immune compromising risk factors but is not been hospitalized in the last 3 months.  Will cover for aspiration pneumonia given the initial infiltrate in the right lower lobe and history of aspiration.  Unasyn and azithromycin ordered.  Sepsis - Repeat Assessment  Performed at:    5:18 PM   Vitals      Vitals:   03/05/2018 1700 02/14/2018 1713  BP:  (!) 142/89  Pulse: 87 96  Resp: 20 (!) 28  Temp:    SpO2: 98% 93%    Heart:     Regular rate and rhythm  Lungs:    Rales  Capillary Refill:   <2 sec  Peripheral Pulse:   Dorsalis pedis pulse  palpable and 2+.   Skin:     Normal Color  Mental status improving.   Spoke with hospitalist Dr. Rhona Leavens who has decided to admit.  Appreciate his involvement.   Final Clinical Impressions(s) / ED Diagnoses   Final diagnoses:  Community acquired pneumonia, unspecified laterality  Hypokalemia    ED Discharge Orders    None       Elisha Ponder, PA-C 02/24/2018 1721    Delia Chimes 02/17/2018 2156    Pricilla Loveless, MD 02/20/18 1122

## 2018-02-19 NOTE — Progress Notes (Addendum)
Pharmacy Antibiotic Note  Tenicia Bergstrom is a 78 y.o. female admitted on Mar 04, 2018 with pneumonia.  Pharmacy has been consulted for Unasyn dosing.  Plan: Unasyn 3gm q8 Azithromycin 500mg  Iv q24    Temp (24hrs), Avg:99.4 F (37.4 C), Min:99.2 F (37.3 C), Max:99.5 F (37.5 C)  Recent Labs  Lab 03/04/2018 1412 03-04-18 1413  WBC 12.2*  --   CREATININE 0.82  --   LATICACIDVEN  --  2.0*    Estimated Creatinine Clearance: 44.7 mL/min (by C-G formula based on SCr of 0.82 mg/dL).    Allergies  Allergen Reactions  . Latex Rash    If leave it on too long   Antimicrobials this admission: 2/13 Unasyn >>  2/13 Azithromycin >>   Dose adjustments this admission:  Microbiology results: 2/13 BCx: sent  Prev Cx 2/5 UCx: < 10K colonies 1/31 BCx: ng-final 1/31 UCx: > 100K Kleb pneumo, Ampicillin resistant  Thank you for allowing pharmacy to be a part of this patient's care.  Otho Bellows PharmD Pager [phone] 04-Mar-2018, 6:33 PM

## 2018-02-19 NOTE — ED Notes (Signed)
Bed: WA01 Expected date:  Expected time:  Means of arrival:  Comments: SOB 

## 2018-02-20 ENCOUNTER — Other Ambulatory Visit: Payer: Self-pay

## 2018-02-20 ENCOUNTER — Inpatient Hospital Stay (HOSPITAL_COMMUNITY): Payer: Medicare Other

## 2018-02-20 ENCOUNTER — Encounter: Payer: Self-pay | Admitting: Nurse Practitioner

## 2018-02-20 ENCOUNTER — Ambulatory Visit: Payer: Medicare Other | Admitting: Physical Therapy

## 2018-02-20 DIAGNOSIS — E782 Mixed hyperlipidemia: Secondary | ICD-10-CM

## 2018-02-20 DIAGNOSIS — R9431 Abnormal electrocardiogram [ECG] [EKG]: Secondary | ICD-10-CM

## 2018-02-20 LAB — COMPREHENSIVE METABOLIC PANEL
ALT: 13 U/L (ref 0–44)
AST: 26 U/L (ref 15–41)
Albumin: 2.2 g/dL — ABNORMAL LOW (ref 3.5–5.0)
Alkaline Phosphatase: 52 U/L (ref 38–126)
Anion gap: 9 (ref 5–15)
BUN: 17 mg/dL (ref 8–23)
CO2: 20 mmol/L — ABNORMAL LOW (ref 22–32)
Calcium: 7.8 mg/dL — ABNORMAL LOW (ref 8.9–10.3)
Chloride: 112 mmol/L — ABNORMAL HIGH (ref 98–111)
Creatinine, Ser: 0.61 mg/dL (ref 0.44–1.00)
GFR calc Af Amer: >60 mL/min (ref >60)
Glucose, Bld: 112 mg/dL — ABNORMAL HIGH (ref 70–99)
Potassium: 3.7 mmol/L (ref 3.5–5.1)
Sodium: 141 mmol/L (ref 135–145)
Total Bilirubin: 0.6 mg/dL (ref 0.3–1.2)
Total Protein: 6 g/dL — ABNORMAL LOW (ref 6.5–8.1)

## 2018-02-20 LAB — CBC
HCT: 33.9 % — ABNORMAL LOW (ref 36.0–46.0)
Hemoglobin: 10 g/dL — ABNORMAL LOW (ref 12.0–15.0)
MCH: 29.2 pg (ref 26.0–34.0)
MCHC: 29.5 g/dL — ABNORMAL LOW (ref 30.0–36.0)
MCV: 98.8 fL (ref 80.0–100.0)
Platelets: 280 10*3/uL (ref 150–400)
RBC: 3.43 MIL/uL — ABNORMAL LOW (ref 3.87–5.11)
RDW: 16.7 % — ABNORMAL HIGH (ref 11.5–15.5)
WBC: 12.9 10*3/uL — AB (ref 4.0–10.5)
nRBC: 0 % (ref 0.0–0.2)

## 2018-02-20 LAB — HIV ANTIBODY (ROUTINE TESTING W REFLEX): HIV Screen 4th Generation wRfx: NONREACTIVE

## 2018-02-20 LAB — ECHOCARDIOGRAM COMPLETE
Height: 60 in
Weight: 1947.1 oz

## 2018-02-20 MED ORDER — POTASSIUM CHLORIDE CRYS ER 20 MEQ PO TBCR
40.0000 meq | EXTENDED_RELEASE_TABLET | Freq: Once | ORAL | Status: AC
  Administered 2018-02-20: 40 meq via ORAL
  Filled 2018-02-20: qty 2

## 2018-02-20 MED ORDER — ENSURE ENLIVE PO LIQD
237.0000 mL | Freq: Three times a day (TID) | ORAL | Status: DC
  Administered 2018-02-20 – 2018-02-21 (×4): 237 mL via ORAL

## 2018-02-20 MED ORDER — SODIUM CHLORIDE 0.9 % IV SOLN
3.0000 g | Freq: Four times a day (QID) | INTRAVENOUS | Status: DC
  Administered 2018-02-20 – 2018-02-23 (×12): 3 g via INTRAVENOUS
  Filled 2018-02-20 (×13): qty 3

## 2018-02-20 MED ORDER — METOPROLOL TARTRATE 25 MG PO TABS
12.5000 mg | ORAL_TABLET | Freq: Two times a day (BID) | ORAL | Status: DC
  Administered 2018-02-20 – 2018-02-21 (×4): 12.5 mg via ORAL
  Filled 2018-02-20 (×5): qty 1

## 2018-02-20 MED ORDER — RESOURCE THICKENUP CLEAR PO POWD
ORAL | Status: DC | PRN
  Administered 2018-02-21: 11:00:00 via ORAL
  Filled 2018-02-20: qty 125

## 2018-02-20 NOTE — Progress Notes (Signed)
Initial Nutrition Assessment  DOCUMENTATION CODES:   Non-severe (moderate) malnutrition in context of chronic illness  INTERVENTION:  Increase Ensure Enlive po TID, each supplement provides 350 kcal and 20 grams of protein  Magic cup TID with meals, each supplement provides 290 kcal and 9 grams of protein Continue to monitor PO intake.  NUTRITION DIAGNOSIS:   Moderate Malnutrition related to chronic illness(dementia) as evidenced by mild muscle depletion, moderate muscle depletion, mild fat depletion, moderate fat depletion, 10 percent weight loss in 2 months.  GOAL:   Patient will meet greater than or equal to 90% of their needs   MONITOR:   PO intake, Supplement acceptance, Weight trends  REASON FOR ASSESSMENT:   Malnutrition Screening Tool    ASSESSMENT:   78 year old female w/ PMH of depression, HTN, HLD, CKD, and dementia. PCP diagnosed her w/ PNA on 02/16/2018.   Spoke with Pt and her son in Social worker. SLP was in the room and we spoke; was informed that she has had poor intake recently, is holding food in her mouth and presents with dysphagia. Pt has immediate cough after swallowing. MBS schedule for noon, awaiting results.  Son in law reported that she has been declining for the last 3-4 weeks after she had a previous admission for an UTI. In those 3-4 weeks she has had decreased mobility and ability to eat on her own. She requires assistance moving from her bed to chairs, dressing, and walking.   Prior to the decline in health, pt was feeding herself and making her meals. She was eating 2-3 meals a day; eating foods such as chicken biscuits, fruits, vegetables, and soup with grilled cheese. Now during these last 3-4 weeks, she is only able to have a few sips of Ensure, soup, or applesauce and is fed by her family. Son in law endorses wt loss; 25# in last year, 10# in last 3-4 months. He reported UBW 140-145#. Son in law denies n/v or diarrhea, but states she has experienced  constipation on and off.   NFPE revealed mild to moderate muscle and fat depletion. Per pt chart, she has experienced a wt loss of 10% in 2 months which is significant for the time frame. Pt meets the criteria for malnutrition. Recommend Ensure TID and Magic Cup TID.  Medications reviewed and include: folic acid 1mg , prednisone 5mg  Labs reviewed: K 3.7 (WNL)   NUTRITION - FOCUSED PHYSICAL EXAM:    Most Recent Value  Orbital Region  Moderate depletion  Upper Arm Region  Mild depletion  Thoracic and Lumbar Region  Moderate depletion  Buccal Region  Mild depletion  Temple Region  Moderate depletion  Clavicle Bone Region  Moderate depletion  Clavicle and Acromion Bone Region  Moderate depletion  Scapular Bone Region  Unable to assess  Dorsal Hand  No depletion  Patellar Region  Mild depletion  Anterior Thigh Region  No depletion  Posterior Calf Region  Mild depletion  Edema (RD Assessment)  None  Hair  Reviewed  Eyes  Reviewed  Mouth  Reviewed  Skin  Reviewed  Nails  Reviewed       Diet Order:   Diet Order            Diet regular Room service appropriate? Yes; Fluid consistency: Thin  Diet effective now              EDUCATION NEEDS:   Education needs have been addressed  Skin:  Skin Assessment: Reviewed RN Assessment Unstageable: Buttocks Other:  MASD buttocks  Last BM:     Height:   Ht Readings from Last 1 Encounters:  02/20/18 5' (1.524 m)    Weight:   Wt Readings from Last 1 Encounters:  02/20/18 55.2 kg    Ideal Body Weight:  45.45 kg  BMI:  Body mass index is 23.77 kg/m.  Estimated Nutritional Needs:   Kcal:  1400-1600 kcal  Protein:  70-80 g  Fluid:  >/= 1.4L   [employer] Dietetic Intern

## 2018-02-20 NOTE — Progress Notes (Signed)
MBS completed, Full report to follow.  Pt presents with moderate oral and mild pharyngeal dysphagia with sensorimotor components. Also suspect component of esophageal deficits given upon sweep, pt appeared with residuals *lighter shade ? Secretions* in esophagus without awareness.  Decreased oral control, oral holding results in delayed transiting, premature spillage and oral residuals.  Pt with decreased sensation to oral residuals and needs cues to swallow when she is able to elicit- SLP used visual cues of "chewing" to allow pt to mimic mastication.  She required puree to orally transit and swallow cracker bolus after she did not elicit swallow for several minutes of time.    Aspiration of thin *minimal* x at least once of thin = which pt sensed and presented with reflexive cough.(SLP to review test further)  Pt allow spillage of bolus into larynx prior to triggering a reflexive swallow.  Reflexive cough was weak and not effective to clear.  Chin tuck posture appeared to help protect airway as it resulted in only trace laryngeal penetration of thin.  Pt is also mildly penetrating nectar boluses.    Given pt's current medical illness/pna - would recommend to modify diet to dys3/ground meats/nectar and provide thin water via tsp between meals after mouth care.  Please use puree to aid oral transiting of solids after pt masticates them - as she will hold and not elicit swallow. Will need to follow VERY strict aspiration precautions to mitigate aspiration risk.    Daughter Tricia Daniel present and was educated to findings of MBS using video loops to aid understanding.  Will follow up for tolerance and ongoing education regarding risk factors for aspiration pneumonias.      Tricia Burnet, MS [facility] SLP Acute Rehab Services Pager [phone] Office [phone]

## 2018-02-20 NOTE — Progress Notes (Signed)
Rehab Admissions Coordinator Note:  Per family request, this patient was screened by [name] for appropriateness for an Inpatient Acute Rehab Consult.  At this time, it does not appear that this patient is able to tolerate the intensity of the program. Pt is not appropriate for CIR. AC has contacted CM to relay recommendation.    [name] 02/20/2018, 3:30 PM  I can be reached at [phone].

## 2018-02-20 NOTE — Progress Notes (Signed)
Pharmacy Antibiotic Note  Tricia Daniel is a 78 y.o. female admitted on 02/14/2018 with pneumonia.  Pharmacy has been consulted for ampicillin/sulbactam dosing.  Today, 02/20/18  WBX 12.9 remains slightly elevated  SCr 0.6 - WNL, stable. CrCl ~40 mL/min  Afebrile  D2 antibiotics, currently on ampicillin/sulbactam 3 g IV q8h and azithromycin 500 mg IV q24h  Plan:  Increase ampicillin/sulbactam to 3 g IV q6h  Azithromycin 500 mg IV daily per MD  Pharmacy to sign off, please re-consult if needed.  Height: 5' (152.4 cm) Weight: 121 lb 11.1 oz (55.2 kg) IBW/kg (Calculated) : 45.5  Temp (24hrs), Avg:98.9 F (37.2 C), Min:98.5 F (36.9 C), Max:99.2 F (37.3 C)  Recent Labs  Lab 02/14/2018 1412 03/01/2018 1413 02/18/2018 1852 02/20/18 0553  WBC 12.2*  --   --  12.9*  CREATININE 0.82  --   --  0.61  LATICACIDVEN  --  2.0* 1.4  --     Estimated Creatinine Clearance: 45.2 mL/min (by C-G formula based on SCr of 0.61 mg/dL).    Allergies  Allergen Reactions  . Latex Rash    If leave it on too long   Antimicrobials this admission: 2/13 Unasyn >>  2/13 Azithromycin >>   Dose adjustments this admission: 2/14 Ampicillin/sulbactam 3 g IV q8h --> 3 g IV q6h  Microbiology results: 2/13 BCx: ngtd  Prev Cx 2/5 UCx: < 10K colonies 1/31 BCx: ng-final 1/31 UCx: > 100K Kleb pneumo, Ampicillin resistant  Thank you for allowing pharmacy to be a part of this patient's care.  Cindi Carbon, PharmD 02/20/18 2:48 PM

## 2018-02-20 NOTE — Evaluation (Signed)
Occupational Therapy Evaluation Patient Details Name: Tricia Daniel MRN: [id number] DOB: 1940-02-16 Today's Date: 02/20/2018    History of Present Illness pt was admitted with SOB.  Dx with sepsis from pna.  PMH;; dementia and arthitis   Clinical Impression   This 78 year old female was admitted for the above.  She has had a recent decline in function and had been going to OP neuro OT/PT up until a month ago. Son in law present and providing history.  Family wants to take pt home and may benefit from hoyer lift, depending upon functional improvement.  Goals focus on assisting with mobility to decrease burden of care as well as improving participation in adls.    Follow Up Recommendations  Home health OT    Equipment Recommendations  (? hoyer lift)    Recommendations for Other Services       Precautions / Restrictions Precautions Precautions: Fall Restrictions Weight Bearing Restrictions: No      Mobility Bed Mobility Overal bed mobility: Needs Assistance Bed Mobility: Supine to Sit     Supine to sit: Total assist;+2 for physical assistance     General bed mobility comments: assist for legs and trunk  Transfers Overall transfer level: Needs assistance Equipment used: 2 person hand held assist Transfers: Sit to/from Stand Sit to Stand: Max assist;+2 physical assistance Stand pivot transfers: Max assist;+2 physical assistance            Balance Overall balance assessment: Needs assistance   Sitting balance-Leahy Scale: Zero Sitting balance - Comments: tends to push backwards                                   ADL either performed or assessed with clinical judgement   ADL Overall ADL's : Needs assistance/impaired Eating/Feeding: Maximal assistance;Sitting(hand over hand assist)   Grooming: Sitting;Maximal assistance(hand over hand assistance)                                 General ADL Comments: pt does not use toilet due to  incontinence. Needs hand over hand, tactile cues to initiate activities. She needs +2 to stand,  and tends to push back, but she does assist with standing up. Total A for bathing/dressing/hygiene     Vision         Perception     Praxis      Pertinent Vitals/Pain Pain Assessment: Faces Faces Pain Scale: No hurt     Hand Dominance     Extremity/Trunk Assessment Upper Extremity Assessment Upper Extremity Assessment: Generalized weakness(krepitation in shoulders)           Communication Communication Communication: No difficulties   Cognition Arousal/Alertness: Awake/alert Behavior During Therapy: WFL for tasks assessed/performed Overall Cognitive Status: History of cognitive impairments - at baseline                                 General Comments: follows some commands with multimodal cues. Fearful of falling   General Comments  son in law present; providing information    Exercises     Shoulder Instructions      Home Living Family/patient expects to be discharged to:: Private residence Living Arrangements: Children  Home Equipment: Hospital bed;Walker - 2 wheels   Additional Comments: pt incontinent at baseline      Prior Functioning/Environment Level of Independence: Needs assistance        Comments: up until a month ago, pt was going to OP neuro OT/PT.  Used walker for transfers. Decline in status since uti.  Family assisting with SPTs, rolling in bed for changing.  They are interested in a hoyer lift, if pt's status does not imiprove        OT Problem List: Decreased strength;Decreased activity tolerance;Impaired balance (sitting and/or standing);Decreased cognition;Decreased knowledge of use of DME or AE      OT Treatment/Interventions: Self-care/ADL training;DME and/or AE instruction;Patient/family education;Balance training;Therapeutic activities    OT Goals(Current goals can be found in the  care plan section) Acute Rehab OT Goals Patient Stated Goal: regain strength; return home OT Goal Formulation: With family Time For Goal Achievement: 03/06/18 Potential to Achieve Goals: Fair ADL Goals Additional ADL Goal #1: pt will perform bed mobility with mod A during adls Additional ADL Goal #2: pt will go from sit to stand with RW with mod A in preparation for transfers Additional ADL Goal #3: pt will self feed and perform grooming tasks with mod A with hand over hand assist to initiate  OT Frequency: Min 2X/week   Barriers to D/C:            Co-evaluation PT/OT/SLP Co-Evaluation/Treatment: Yes Reason for Co-Treatment: For patient/therapist safety PT goals addressed during session: Mobility/safety with mobility;Balance OT goals addressed during session: Strengthening/ROM;ADL's and self-care      AM-PAC OT "6 Clicks" Daily Activity     Outcome Measure Help from another person eating meals?: A Lot Help from another person taking care of personal grooming?: A Lot Help from another person toileting, which includes using toliet, bedpan, or urinal?: Total Help from another person bathing (including washing, rinsing, drying)?: Total Help from another person to put on and taking off regular upper body clothing?: Total Help from another person to put on and taking off regular lower body clothing?: Total 6 Click Score: 8   End of Session    Activity Tolerance: Patient limited by fatigue Patient left: in chair;with call bell/phone within reach;with chair alarm set;with family/visitor present  OT Visit Diagnosis: Unsteadiness on feet (R26.81);Muscle weakness (generalized) (M62.81)                Time: 9163-8466 OT Time Calculation (min): 26 min Charges:  OT General Charges $OT Visit: 1 Visit OT Evaluation $OT Eval Low Complexity: 1 Low  Marica Otter, OTR/L Acute Rehabilitation Services [phone] [facility] pager [phone] office 02/20/2018  Tia Hieronymus 02/20/2018, 10:47  AM

## 2018-02-20 NOTE — Evaluation (Signed)
Clinical/Bedside Swallow Evaluation Patient Details  Name: Tricia Daniel MRN: [id number] Date of Birth: [birth year]  Today's Date: 02/20/2018 Time: SLP Start Time (ACUTE ONLY): 1100 SLP Stop Time (ACUTE ONLY): 1125 SLP Time Calculation (min) (ACUTE ONLY): 25 min  Past Medical History:  Past Medical History:  Diagnosis Date  . Allergy   . Arthritis   . Chronic kidney disease   . Dementia (HCC)   . Depression   . Gait abnormality 04/04/2017  . Hyperlipidemia   . Hypertension    Past Surgical History:  Past Surgical History:  Procedure Laterality Date  . COLOSTOMY  1988  . COLOSTOMY REVERSAL  1999  . nephrectomy Left 1988  . TONSILLECTOMY     HPI:  pt is a 78 yo female with h/o TIA/CVAs who was recently in the hospital with UTI, discharged and then found to have pna.  Pt admitted with sepsis and pna.  CXR showed progressing pna. Swallow eval ordered. Son in law states pt was to have a swallow eval at OP.    Assessment / Plan / Recommendation Clinical Impression  Patient presents with clinical indications of oropharyngeal and possible esophageal dysphagia.  No focal CN deficits present- cognitive based dysphagia likely with oral deficits.  Pt also with ? oral candidiasis? as after oral care some whitish coating remained on tongue and she has been on ABX.  Inferred oral holding and delayed transiting apparent with oral residuals noted with poor awareness. Cued swallows needed to elicit swallow of residuals.  In addition, pt with immediate cough post-swallow of thin liquids to aid oral clearance of applesauce.  Son in law states pt has been orally holding food and not expectorating secretions she coughs up over the last 3 weeks - progressing.  Recommend MBS be completed to allow assessment of oropharyngeal swallow and determine effective compensation strategies.  Son in Industrial/product designer agreeable to plan and states his wife will be here soon - SlP invited them to come for MBS.   SLP Visit Diagnosis:  Dysphagia, oropharyngeal phase (R13.12);Dysphagia, unspecified (R13.10);Other (comment)(suspect esophageal component)    Aspiration Risk  Moderate aspiration risk    Diet Recommendation       tbd   Other  Recommendations     Follow up Recommendations (tbd)      Frequency and Duration   tbd         Prognosis   tbd     Swallow Study   General Date of Onset: 02/20/18 HPI: pt is a 78 yo female with h/o TIA/CVAs who was recently in the hospital with UTI, discharged and then found to have pna.  Pt admitted with sepsis and pna.  CXR showed progressing pna. Swallow eval ordered. Son in law states pt was to have a swallow eval at OP.  Type of Study: Bedside Swallow Evaluation Previous Swallow Assessment: none Diet Prior to this Study: Regular;Thin liquids Temperature Spikes Noted: No Respiratory Status: Nasal cannula History of Recent Intubation: No Behavior/Cognition: Alert;Other (Comment)(decreased ability to follow directions) Oral Cavity Assessment: Other (comment)(whitish spots on tongue, ? if c/w oral candidasis as some remained after oral care) Oral Care Completed by SLP: Yes Oral Cavity - Dentition: Other (Comment)(single tooth) Vision: Functional for self-feeding Self-Feeding Abilities: Needs assist Patient Positioning: Upright in bed Baseline Vocal Quality: Normal Volitional Cough: Strong(productive to frothy secretions) Volitional Swallow: Able to elicit(with great effort)    Oral/Motor/Sensory Function Overall Oral Motor/Sensory Function: Generalized oral weakness   Ice Chips     Thin Liquid  Thin Liquid: Impaired Oral Phase Functional Implications: Prolonged oral transit(inferred) Pharyngeal  Phase Impairments: Cough - Immediate    Nectar Thick Nectar Thick Liquid: Impaired Presentation: Straw Oral phase functional implications: Prolonged oral transit(inferred)   Honey Thick Honey Thick Liquid: Not tested   Puree Puree: Impaired Presentation: Spoon;Self  Fed Oral Phase Impairments: Reduced lingual movement/coordination Oral Phase Functional Implications: Oral holding;Oral residue Other Comments: oral residuals on tongue - left side- with frothy secretions retained on right without her awareness   Solid     Solid: Not tested      Chales Abrahams 02/20/2018,11:40 AM   Donavan Burnet, MS [facility] SLP Acute Rehab Services Pager [phone] Office [phone]

## 2018-02-20 NOTE — Progress Notes (Signed)
PROGRESS NOTE    Tricia DecampMiriam Daniel  WUJ:[id number] DOB: 06/17/1940 DOA: 02/17/2018 PCP: Anne NgNche, Charlotte Lum, NP    Brief Narrative:  78 y.o. female with medical history significant of dementia with sundowning, arthritis on chronic immunosuppressive tx, HLD, HTN, presents to ED with worsening sob and productive cough. Several weeks prior to admit, patient noted to have concerns of swallowing secretions and questionable aspiration. Pt is supposed to follow up with SLP eval this week. Patient was seen 3 days prior to visit where she was diagnosed with PNA on CXR and started on Levaquin. Symptoms worsened with increased sob, thus patient presented to ED for further evaluation  ED Course: In the ED, patient noted to have O2 sats in the 80's on room air, improved with 2LNC. CXR was performed with findings worrisome for worsening PNA despite recent abx tx. Patient was noted to have WBC of 12.2, K of 2.8, lactate of 2.0. Patient was given IVF bolus and started on empiric azithromycin and unasyn. Hospitalist consulted for consideration for admission.  Assessment & Plan:   Principal Problem:   Sepsis due to pneumonia [facility](HCC) Active Problems:   Essential hypertension   Mixed hyperlipidemia   Dementia with behavioral disturbance (HCC)   1. Sepsis secondary to PNA 1. Presents with leukocytosis, tachycardia, elevated lactate with worsening PNA on personal review of CXR 2. Will continue on azithromycin with unasyn for aspiration coverage 3. Follow pan cultures 4. Currently stable on 2LNC, wean O2 as tolerated 5. Given concerns of aspiration, consulted SLP  6. Wean O2 as tolerated 2. HTN 1. Cont home meds as tolerated 2. Stable at present 3. HLD 1. Stable 2. Cont home meds for now 4. Dementia 1. Stable at present 2. Per family, patient known to have marked sun-downing 3. Will continue home anti-psychotics  4. Continued on PRN IV haldol. QTc reviewed, unremarkable 5. Acute hypoxemic respiratory  failure 1. Remains stable on 2LNC 2. Per above, wean O2 as tolerated  DVT prophylaxis: Lovenox subQ Code Status: DNR Family Communication: Pt in room, family at bedside Disposition Plan: Uncertain at this time  Consultants:     Procedures:     Antimicrobials: Anti-infectives (From admission, onward)   Start     Dose/Rate Route Frequency Ordered Stop   02/20/18 1800  azithromycin (ZITHROMAX) 500 mg in sodium chloride 0.9 % 250 mL IVPB     500 mg 250 mL/hr over 60 Minutes Intravenous Every 24 hours 02/28/2018 1805     02/20/18 1500  Ampicillin-Sulbactam (UNASYN) 3 g in sodium chloride 0.9 % 100 mL IVPB     3 g 200 mL/hr over 30 Minutes Intravenous Every 6 hours 02/20/18 1438     03/03/2018 2359  Ampicillin-Sulbactam (UNASYN) 3 g in sodium chloride 0.9 % 100 mL IVPB  Status:  Discontinued     3 g 200 mL/hr over 30 Minutes Intravenous Every 8 hours 03/06/2018 1837 02/20/18 1438   02/08/2018 1600  Ampicillin-Sulbactam (UNASYN) 3 g in sodium chloride 0.9 % 100 mL IVPB     3 g 200 mL/hr over 30 Minutes Intravenous  Once 03/03/2018 1555 02/10/2018 1717   02/22/2018 1600  azithromycin (ZITHROMAX) 500 mg in sodium chloride 0.9 % 250 mL IVPB     500 mg 250 mL/hr over 60 Minutes Intravenous  Once 02/07/2018 1555 02/20/18 0659       Subjective: Without complaints  Objective: Vitals:   02/18/2018 2010 02/20/18 0607 02/20/18 0609 02/20/18 1430  BP: (!) 113/50 133/64  (!) 139/55  Pulse: 94 97  89  Resp: 20     Temp: 98.5 F (36.9 C) 98.9 F (37.2 C)  98.8 F (37.1 C)  TempSrc: Oral Oral  Axillary  SpO2: 95% 92%  94%  Weight:   55.2 kg   Height:   5' (1.524 m)     Intake/Output Summary (Last 24 hours) at 02/20/2018 1658 Last data filed at 02/20/2018 1649 Gross per 24 hour  Intake 1700 ml  Output 250 ml  Net 1450 ml   Filed Weights   02/20/18 0609  Weight: 55.2 kg    Examination:  General exam: Appears calm and comfortable  Respiratory system: Clear to auscultation. Respiratory  effort normal. Cardiovascular system: S1 & S2 heard, RRR Gastrointestinal system: Abdomen is nondistended, soft and nontender. No organomegaly or masses felt. Normal bowel sounds heard. Central nervous system: Alert and oriented. No focal neurological deficits. Extremities: Symmetric 5 x 5 power. Skin: No rashes, lesions Psychiatry: Judgement and insight appear normal. Mood & affect appropriate.   Data Reviewed: I have personally reviewed following labs and imaging studies  CBC: Recent Labs  Lab 02/23/2018 1412 02/20/18 0553  WBC 12.2* 12.9*  NEUTROABS 9.2*  --   HGB 12.1 10.0*  HCT 40.0 33.9*  MCV 96.4 98.8  PLT 359 280   Basic Metabolic Panel: Recent Labs  Lab 23-Feb-2018 1412 02/20/18 0553  NA 139 141  K 2.8* 3.7  CL 110 112*  CO2 20* 20*  GLUCOSE 170* 112*  BUN 23 17  CREATININE 0.82 0.61  CALCIUM 8.2* 7.8*  MG 1.9  --    GFR: Estimated Creatinine Clearance: 45.2 mL/min (by C-G formula based on SCr of 0.61 mg/dL). Liver Function Tests: Recent Labs  Lab 02-23-18 1412 02/20/18 0553  AST 33 26  ALT 15 13  ALKPHOS 72 52  BILITOT 0.5 0.6  PROT 7.1 6.0*  ALBUMIN 2.9* 2.2*   No results for input(s): LIPASE, AMYLASE in the last 168 hours. No results for input(s): AMMONIA in the last 168 hours. Coagulation Profile: Recent Labs  Lab February 23, 2018 1412  INR 1.10   Cardiac Enzymes: No results for input(s): CKTOTAL, CKMB, CKMBINDEX, TROPONINI in the last 168 hours. BNP (last 3 results) No results for input(s): PROBNP in the last 8760 hours. HbA1C: No results for input(s): HGBA1C in the last 72 hours. CBG: No results for input(s): GLUCAP in the last 168 hours. Lipid Profile: No results for input(s): CHOL, HDL, LDLCALC, TRIG, CHOLHDL, LDLDIRECT in the last 72 hours. Thyroid Function Tests: No results for input(s): TSH, T4TOTAL, FREET4, T3FREE, THYROIDAB in the last 72 hours. Anemia Panel: No results for input(s): VITAMINB12, FOLATE, FERRITIN, TIBC, IRON, RETICCTPCT  in the last 72 hours. Sepsis Labs: Recent Labs  Lab 02/23/18 1413 2018/02/23 1852  LATICACIDVEN 2.0* 1.4    Recent Results (from the past 240 hour(s))  Urine Culture     Status: None   Collection Time: 02/11/18  3:29 PM  Result Value Ref Range Status   MICRO NUMBER: [id number]  Final   SPECIMEN QUALITY: Adequate  Final   Sample Source URINE  Final   STATUS: FINAL  Final   Result:   Final    Single organism less than 10,000 CFU/mL isolated. These organisms, commonly found on external and internal genitalia, are considered colonizers. No further testing performed.  Culture, blood (Routine x 2)     Status: None (Preliminary result)   Collection Time: 2018-02-23  2:13 PM  Result Value Ref Range Status  Specimen Description   Final    BLOOD RIGHT HAND Performed at [facility], [address] [place], [place] [address]    Special Requests   Final    BOTTLES DRAWN AEROBIC AND ANAEROBIC Blood Culture results may not be optimal due to an excessive volume of blood received in culture bottles Performed at [facility], [address] [place], [place] [address]    Culture   Final    NO GROWTH < 24 HOURS Performed at [facility], [address] [place], [place] [address]    Report Status PENDING  Incomplete  Culture, blood (Routine x 2)     Status: None (Preliminary result)   Collection Time: 2018/02/23  3:59 PM  Result Value Ref Range Status   Specimen Description   Final    BLOOD LEFT ANTECUBITAL Performed at [facility], [address], [place], [place] [address]    Special Requests   Final    BOTTLES DRAWN AEROBIC AND ANAEROBIC Blood Culture adequate volume Performed at [facility], [address], [place], [place] [address]    Culture   Final    NO GROWTH < 12 HOURS Performed at [facility], [address], [place], [place] [address]    Report Status PENDING  Incomplete     Radiology  Studies: Dg Chest 2 View  Result Date: Feb 23, 2018 CLINICAL DATA:  Pt diagnosed with pna on Monday. Antibiotics at home. Not improving. Coughing. Shortness of breath. Hx HTN, CKD. EXAM: CHEST - 2 VIEW COMPARISON:  02/16/2018 FINDINGS: Heart size is accentuated by AP technique. Increased airspace filling opacities are identified in the RIGHT UPPER lobe and LEFT perihilar region compared to prior study. Chronic perihilar peribronchial thickening. No pulmonary edema. Convex RIGHT scoliosis of the thoracic spine. IMPRESSION: Increased bilateral infiltrates. Electronically Signed   By: Norva Pavlov M.D.   On: Feb 23, 2018 14:29    Scheduled Meds: . amLODipine  5 mg Oral Daily  . donepezil  5 mg Oral QHS  . enoxaparin (LOVENOX) injection  40 mg Subcutaneous Q24H  . feeding supplement (ENSURE ENLIVE)  237 mL Oral TID BM  . FLUoxetine  10 mg Oral QHS  . FLUoxetine  40 mg Oral Daily  . folic acid  1 mg Oral Daily  . metoprolol tartrate  12.5 mg Oral BID  . predniSONE  5 mg Oral Q breakfast  . QUEtiapine  25 mg Oral QHS  . simvastatin  20 mg Oral QPM  . sodium chloride flush  3 mL Intravenous Once   Continuous Infusions: . ampicillin-sulbactam (UNASYN) IV Stopped (02/20/18 1649)  . azithromycin       LOS: 1 day   Rickey Barbara, MD [facility] Pager On Amion  If 7PM-7AM, please contact night-coverage 02/20/2018, 4:58 PM

## 2018-02-20 NOTE — Care Management (Signed)
Spoke with patients daughter in room with patient. They are requesting help at home. Discussed what services that insurance would cover and what would be private pay. Daughter asked if CIR would be an option. Discussed with attending and he will place a referral. She is open to SNF if this is not approved. Will f/u after CIR evaluation.

## 2018-02-20 NOTE — Evaluation (Signed)
Physical Therapy Evaluation Patient Details Name: Tricia Daniel MRN: [id number] DOB: 1940/08/14 Today's Date: 02/20/2018   History of Present Illness  Pt is a 78 year old female admitted with SOB.  Dx with sepsis from pna.  PMH;; dementia and arthitis  Clinical Impression  Pt admitted with above diagnosis. Pt currently with functional limitations due to the deficits listed below (see PT Problem List).  Pt will benefit from skilled PT to increase their independence and safety with mobility to allow discharge to the venue listed below.  Pt requiring increased assist for bed mobility and transferring to recliner.  Son reports decline in mobility lately where he was lifting pt to w/c just prior to admission.  Son reports plan is for family to bring pt home however may need hoyer lift to decrease burden of care depending on pt's progress.       Follow Up Recommendations SNF(son prefers home, considering HHPT)    Equipment Recommendations  Other (comment)(lift)    Recommendations for Other Services       Precautions / Restrictions Precautions Precautions: Fall Precaution Comments: monitor sats - currently on 2L O2 Restrictions Weight Bearing Restrictions: No      Mobility  Bed Mobility Overal bed mobility: Needs Assistance Bed Mobility: Supine to Sit     Supine to sit: Total assist;+2 for physical assistance     General bed mobility comments: assist for legs and trunk, pt fearful of scooting to EOB, utilized bed pad and blocked knees  Transfers Overall transfer level: Needs assistance Equipment used: 2 person hand held assist Transfers: Stand Pivot Transfers Sit to Stand: Max assist;+2 physical assistance Stand pivot transfers: Max assist;+2 physical assistance       General transfer comment: pt with posterior preference however able to stand erect to pivot with max assist, pt remained on 2L O2 [place]  Ambulation/Gait                Stairs            Wheelchair  Mobility    Modified Rankin (Stroke Patients Only)       Balance Overall balance assessment: Needs assistance Sitting-balance support: Bilateral upper extremity supported;Feet supported Sitting balance-Leahy Scale: Zero Sitting balance - Comments: tends to push backwards   Standing balance support: Bilateral upper extremity supported Standing balance-Leahy Scale: Zero                               Pertinent Vitals/Pain Pain Assessment: Faces Faces Pain Scale: No hurt    Home Living Family/patient expects to be discharged to:: Private residence Living Arrangements: Children           Home Layout: Able to live on main level with bedroom/bathroom Home Equipment: Hospital bed;Walker - 2 wheels;Wheelchair - manual Additional Comments: pt incontinent at baseline    Prior Function Level of Independence: Needs assistance   Gait / Transfers Assistance Needed: was previously ambulatory with RW however son reports he has been lifting her to w/c prior to admission     Comments: up until a month ago, pt was going to OP neuro OT/PT.  Used walker for transfers. Decline in status since uti.  Family assisting with SPTs, rolling in bed for changing.  They are interested in a hoyer lift, if pt's status does not imiprove     Hand Dominance        Extremity/Trunk Assessment   Upper Extremity Assessment Upper Extremity  Assessment: Generalized weakness(krepitation in shoulders)    Lower Extremity Assessment Lower Extremity Assessment: Generalized weakness;RLE deficits/detail;LLE deficits/detail RLE Deficits / Details: R knee crepitus felt with ROM likely from arthritis LLE Deficits / Details: resting LE spasms - son reports since change in her meds       Communication   Communication: No difficulties  Cognition Arousal/Alertness: Awake/alert Behavior During Therapy: WFL for tasks assessed/performed Overall Cognitive Status: History of cognitive impairments - at  baseline                                 General Comments: follows some commands with multimodal cues. Fearful of falling      General Comments General comments (skin integrity, edema, etc.): son in law present; providing information    Exercises     Assessment/Plan    PT Assessment Patient needs continued PT services  PT Problem List Decreased strength;Decreased mobility;Decreased activity tolerance;Decreased balance;Decreased knowledge of use of DME;Cardiopulmonary status limiting activity       PT Treatment Interventions DME instruction;Functional mobility training;Balance training;Patient/family education;Gait training;Therapeutic activities;Wheelchair mobility training;Therapeutic exercise;Stair training;Neuromuscular re-education    PT Goals (Current goals can be found in the Care Plan section)  Acute Rehab PT Goals Patient Stated Goal: regain strength; return home PT Goal Formulation: With patient Time For Goal Achievement: 03/06/18 Potential to Achieve Goals: Good    Frequency Min 3X/week   Barriers to discharge        Co-evaluation   Reason for Co-Treatment: For patient/therapist safety PT goals addressed during session: Mobility/safety with mobility;Balance OT goals addressed during session: ADL's and self-care;Strengthening/ROM       AM-PAC PT "6 Clicks" Mobility  Outcome Measure Help needed turning from your back to your side while in a flat bed without using bedrails?: Total Help needed moving from lying on your back to sitting on the side of a flat bed without using bedrails?: Total Help needed moving to and from a bed to a chair (including a wheelchair)?: Total Help needed standing up from a chair using your arms (e.g., wheelchair or bedside chair)?: Total Help needed to walk in hospital room?: Total Help needed climbing 3-5 steps with a railing? : Total 6 Click Score: 6    End of Session Equipment Utilized During Treatment: Gait  belt;Oxygen Activity Tolerance: Patient tolerated treatment well Patient left: with call bell/phone within reach;in chair;with chair alarm set;with family/visitor present Nurse Communication: Mobility status;Need for lift equipment PT Visit Diagnosis: Muscle weakness (generalized) (M62.81)    Time: 1735-6701 PT Time Calculation (min) (ACUTE ONLY): 23 min   Charges:   PT Evaluation $PT Eval Low Complexity: 1 Low     Zenovia Jarred, PT, DPT Acute Rehabilitation Services Office: [phone] Pager: [phone]   Sarajane Jews 02/20/2018, 12:31 PM

## 2018-02-21 DIAGNOSIS — L8990 Pressure ulcer of unspecified site, unspecified stage: Secondary | ICD-10-CM | POA: Diagnosis present

## 2018-02-21 DIAGNOSIS — E440 Moderate protein-calorie malnutrition: Secondary | ICD-10-CM | POA: Diagnosis present

## 2018-02-21 LAB — BASIC METABOLIC PANEL
Anion gap: 7 (ref 5–15)
BUN: 15 mg/dL (ref 8–23)
CALCIUM: 7.9 mg/dL — AB (ref 8.9–10.3)
CO2: 23 mmol/L (ref 22–32)
Chloride: 114 mmol/L — ABNORMAL HIGH (ref 98–111)
Creatinine, Ser: 0.65 mg/dL (ref 0.44–1.00)
GFR calc Af Amer: >60 mL/min (ref >60)
GFR calc non Af Amer: >60 mL/min (ref >60)
Glucose, Bld: 111 mg/dL — ABNORMAL HIGH (ref 70–99)
Potassium: 3.4 mmol/L — ABNORMAL LOW (ref 3.5–5.1)
Sodium: 144 mmol/L (ref 135–145)

## 2018-02-21 LAB — CBC
HCT: 34.9 % — ABNORMAL LOW (ref 36.0–46.0)
Hemoglobin: 10.2 g/dL — ABNORMAL LOW (ref 12.0–15.0)
MCH: 28.7 pg (ref 26.0–34.0)
MCHC: 29.2 g/dL — ABNORMAL LOW (ref 30.0–36.0)
MCV: 98 fL (ref 80.0–100.0)
Platelets: 338 10*3/uL (ref 150–400)
RBC: 3.56 MIL/uL — ABNORMAL LOW (ref 3.87–5.11)
RDW: 16.8 % — ABNORMAL HIGH (ref 11.5–15.5)
WBC: 11.6 10*3/uL — ABNORMAL HIGH (ref 4.0–10.5)
nRBC: 0 % (ref 0.0–0.2)

## 2018-02-21 MED ORDER — FLUTICASONE PROPIONATE 50 MCG/ACT NA SUSP
1.0000 | Freq: Every day | NASAL | Status: DC
  Administered 2018-02-21 – 2018-02-22 (×2): 1 via NASAL
  Filled 2018-02-21: qty 16

## 2018-02-21 MED ORDER — POTASSIUM CHLORIDE CRYS ER 20 MEQ PO TBCR
40.0000 meq | EXTENDED_RELEASE_TABLET | Freq: Once | ORAL | Status: AC
  Administered 2018-02-21: 40 meq via ORAL
  Filled 2018-02-21: qty 2

## 2018-02-21 MED ORDER — AZITHROMYCIN 250 MG PO TABS
500.0000 mg | ORAL_TABLET | Freq: Every day | ORAL | Status: DC
  Administered 2018-02-21: 500 mg via ORAL
  Filled 2018-02-21: qty 2

## 2018-02-21 NOTE — Progress Notes (Signed)
  Speech Language Pathology Treatment: Dysphagia  Patient Details Name: Tricia Daniel MRN: [id number] DOB: September 10, 1940 Today's Date: 02/21/2018 Time: 3491-7915 SLP Time Calculation (min) (ACUTE ONLY): 33 min  Assessment / Plan / Recommendation Clinical Impression  Pt seen for reassessment due to new orders placed after pt coughed up nectar-thick apple juice this afternoon. Daughter Gavin Pound present. SLP cleaned oral cavity, removing viscous yellow-white secretions from palate and tongue. SLP educated daughter re: compensations and precautions recommended per MBS yesterday. Pt required usual mod-max cues to reduce oral holding, most notable with cup sips of liquid. Straw sips appeared to reduce oral holding time, although pt continued to require cues intermittently, particularly when distracted. SLP demo'd using straw to facilitate chin tuck to reduce cognitive burden/overcuing. Daughter demonstrates good understanding of precautions with teach-back and uses appropriate cuing strategies for chin tuck and to cue swallow initiation with occasional min A. Advise pt continue current diet (dysphagia 3, nectar thick liquids, allow thin water between meals after oral care). Reinforced swallow precautions including chin tuck with liquids, and upright posture during and for 30 minutes after meals. Pt remains at risk for aspiration, though close monitoring and full assistance for compensatory strategies can help reduce risk.     HPI HPI: pt is a 78 yo female with h/o TIA/CVAs who was recently in the hospital with UTI, discharged and then found to have pna.  Pt admitted with sepsis and pna.  CXR showed progressing pna. Swallow eval ordered. Son in law states pt was to have a swallow eval at OP.       SLP Plan  Continue with current plan of care       Recommendations  Diet recommendations: Dysphagia 3 (mechanical soft);Nectar-thick liquid;Other(comment)(thin water between meals after oral care) Liquids  provided via: Cup;Straw Medication Administration: Crushed with puree Supervision: Full supervision/cueing for compensatory strategies;Staff to assist with self feeding Compensations: Slow rate;Small sips/bites;Use straw to facilitate chin tuck(tactile cues for swallow initiation, check oral cavity) Postural Changes and/or Swallow Maneuvers: Seated upright 90 degrees;Upright 30-60 min after meal;Chin tuck                Oral Care Recommendations: Oral care BID Follow up Recommendations: Other (comment)(tbd) SLP Visit Diagnosis: Dysphagia, oropharyngeal phase (R13.12);Dysphagia, unspecified (R13.10);Other (comment) Plan: Continue with current plan of care       GO               Rondel Baton, MS, CCC-SLP Speech-Language Pathologist Acute Rehabilitation Services Pager: [phone] Office: [phone]  Arlana Lindau 02/21/2018, 6:08 PM

## 2018-02-21 NOTE — Progress Notes (Signed)
Patient/Patient daughter instructed on flutter valve. Patient demonstrated good effort and daughter understood use as well. Patient placed on 6 L HFNC ([place]) due to O2 sats of 85%; O2 sats increased to 95%.

## 2018-02-21 NOTE — Progress Notes (Signed)
PHARMACIST - PHYSICIAN COMMUNICATION CONCERNING: Antibiotic IV to Oral Route Change Policy  RECOMMENDATION: This patient is receiving azithromycin by the intravenous route.  Based on criteria approved by the Pharmacy and Therapeutics Committee, the antibiotic(s) is/are being converted to the equivalent oral dose form(s).   DESCRIPTION: These criteria include:  Patient being treated for a respiratory tract infection, urinary tract infection, cellulitis or clostridium difficile associated diarrhea if on metronidazole  The patient is not neutropenic and does not exhibit a GI malabsorption state  The patient is eating (either orally or via tube) and/or has been taking other orally administered medications for a least 24 hours  The patient is improving clinically and has a Tmax < 100.5  If you have questions about this conversion, please contact the Pharmacy Department  []   [phone] )  [facility] []   [phone] )  [facility] []   [phone] )  [facility] []   [phone] )  [facility] [x]   [phone] )  [facility]  Herby Abraham, [place].D  02/21/2018 10:41 AM

## 2018-02-21 NOTE — Progress Notes (Signed)
PROGRESS NOTE    Tricia Daniel  RXV:[id number] DOB: 06/17/1940 DOA: 03-19-18 PCP: Anne Ng, NP    Brief Narrative:  78 y.o. female with medical history significant of dementia with sundowning, arthritis on chronic immunosuppressive tx, HLD, HTN, presents to ED with worsening sob and productive cough. Several weeks prior to admit, patient noted to have concerns of swallowing secretions and questionable aspiration. Pt is supposed to follow up with SLP eval this week. Patient was seen 3 days prior to visit where she was diagnosed with PNA on CXR and started on Levaquin. Symptoms worsened with increased sob, thus patient presented to ED for further evaluation  ED Course: In the ED, patient noted to have O2 sats in the 80's on room air, improved with 2LNC. CXR was performed with findings worrisome for worsening PNA despite recent abx tx. Patient was noted to have WBC of 12.2, K of 2.8, lactate of 2.0. Patient was given IVF bolus and started on empiric azithromycin and unasyn. Hospitalist consulted for consideration for admission.  Assessment & Plan:   Principal Problem:   Sepsis due to pneumonia [facility]) Active Problems:   Essential hypertension   Mixed hyperlipidemia   Dementia with behavioral disturbance (HCC)   Malnutrition of moderate degree   Pressure injury of skin  1. Sepsis secondary to PNA 1. Presents with leukocytosis, tachycardia, elevated lactate with worsening PNA on personal review of CXR 2. Will continue on azithromycin with unasyn for aspiration coverage 3. Follow pan cultures 4. Currently stable on 2LNC, wean O2 as tolerated 5. Given concerns of aspiration, consulted SLP with recommendation for dysphagia 3 diet with thickened liquids 6. This afternoon, O2 requirements up to [facility], noted to cough up juice, thus concerns of re-aspiration 7. Re-consulted SLP with recs to continue as previously planned 2. HTN 1. Cont home meds as tolerated 2. Currently  stable 3. HLD 1. Presently stable 2. Cont home meds for now 4. Dementia 1. Stable at present 2. Per family, patient known to have marked sun-downing, presently stable 3. Will continue home anti-psychotics  4. Continued on PRN IV haldol. QTc reviewed, unremarkable 5. Acute hypoxemic respiratory failure 1. Per above, wean O2 as tolerated 2. This afternoon, O2 requirements worsened to [facility], concerns for re-aspiration event 3. Seen again by SLP with recs for continued dysphagia 3 diet and thickened liquids 4. Consider palliative care consult given recurrent aspiration  DVT prophylaxis: Lovenox subQ Code Status: DNR Family Communication: Pt in room, family at bedside Disposition Plan: Uncertain at this time  Consultants:     Procedures:     Antimicrobials: Anti-infectives (From admission, onward)   Start     Dose/Rate Route Frequency Ordered Stop   02/21/18 1800  azithromycin (ZITHROMAX) tablet 500 mg     500 mg Oral Daily-1800 02/21/18 1040     02/20/18 1800  azithromycin (ZITHROMAX) 500 mg in sodium chloride 0.9 % 250 mL IVPB  Status:  Discontinued     500 mg 250 mL/hr over 60 Minutes Intravenous Every 24 hours 03/19/2018 1805 02/21/18 1040   02/20/18 1500  Ampicillin-Sulbactam (UNASYN) 3 g in sodium chloride 0.9 % 100 mL IVPB     3 g 200 mL/hr over 30 Minutes Intravenous Every 6 hours 02/20/18 1438     2018/03/19 2359  Ampicillin-Sulbactam (UNASYN) 3 g in sodium chloride 0.9 % 100 mL IVPB  Status:  Discontinued     3 g 200 mL/hr over 30 Minutes Intravenous Every 8 hours 19-Mar-2018 1837 02/20/18 1438  2018-08-30 1600  Ampicillin-Sulbactam (UNASYN) 3 g in sodium chloride 0.9 % 100 mL IVPB     3 g 200 mL/hr over 30 Minutes Intravenous  Once 2018-08-30 1555 2018-08-30 1717   2018-08-30 1600  azithromycin (ZITHROMAX) 500 mg in sodium chloride 0.9 % 250 mL IVPB     500 mg 250 mL/hr over 60 Minutes Intravenous  Once 2018-08-30 1555 02/20/18 0659      Subjective: This AM, patient without  complaints. Stated feeling better. Became more sob this afternoon  Objective: Vitals:   02/20/18 2043 02/21/18 0541 02/21/18 1424 02/21/18 1425  BP: (!) 126/50 (!) 108/56 129/67   Pulse: 92 86 86   Resp: 16 16 (!) 22   Temp: 98.6 F (37 C) 99.2 F (37.3 C) 99.3 F (37.4 C)   TempSrc: Oral Oral Oral   SpO2: 93% 93% 94% 95%  Weight:      Height:        Intake/Output Summary (Last 24 hours) at 02/21/2018 1827 Last data filed at 02/21/2018 [id number] Gross per 24 hour  Intake 274.84 ml  Output -  Net 274.84 ml   Filed Weights   02/20/18 [id number]  Weight: 55.2 kg    Examination: General exam: Awake, laying in bed, in nad Respiratory system: Normal respiratory effort, no wheezing Cardiovascular system: regular rate, s1, s2 Gastrointestinal system: Soft, nondistended, positive BS Central nervous system: CN2-12 grossly intact, strength intact Extremities: Perfused, no clubbing Skin: Normal skin turgor, no notable skin lesions seen Psychiatry: Mood normal // no visual hallucinations   Data Reviewed: I have personally reviewed following labs and imaging studies  CBC: Recent Labs  Lab 2018-08-30 1412 02/20/18 0553 02/21/18 0356  WBC 12.2* 12.9* 11.6*  NEUTROABS 9.2*  --   --   HGB 12.1 10.0* 10.2*  HCT 40.0 33.9* 34.9*  MCV 96.4 98.8 98.0  PLT 359 280 338   Basic Metabolic Panel: Recent Labs  Lab 2018-08-30 1412 02/20/18 0553 02/21/18 0356  NA 139 141 144  K 2.8* 3.7 3.4*  CL 110 112* 114*  CO2 20* 20* 23  GLUCOSE 170* 112* 111*  BUN 23 17 15   CREATININE 0.82 0.61 0.65  CALCIUM 8.2* 7.8* 7.9*  MG 1.9  --   --    GFR: Estimated Creatinine Clearance: 45.2 mL/min (by C-G formula based on SCr of 0.65 mg/dL). Liver Function Tests: Recent Labs  Lab 2018-08-30 1412 02/20/18 0553  AST 33 26  ALT 15 13  ALKPHOS 72 52  BILITOT 0.5 0.6  PROT 7.1 6.0*  ALBUMIN 2.9* 2.2*   No results for input(s): LIPASE, AMYLASE in the last 168 hours. No results for input(s): AMMONIA in  the last 168 hours. Coagulation Profile: Recent Labs  Lab 2018-08-30 1412  INR 1.10   Cardiac Enzymes: No results for input(s): CKTOTAL, CKMB, CKMBINDEX, TROPONINI in the last 168 hours. BNP (last 3 results) No results for input(s): PROBNP in the last 8760 hours. HbA1C: No results for input(s): HGBA1C in the last 72 hours. CBG: No results for input(s): GLUCAP in the last 168 hours. Lipid Profile: No results for input(s): CHOL, HDL, LDLCALC, TRIG, CHOLHDL, LDLDIRECT in the last 72 hours. Thyroid Function Tests: No results for input(s): TSH, T4TOTAL, FREET4, T3FREE, THYROIDAB in the last 72 hours. Anemia Panel: No results for input(s): VITAMINB12, FOLATE, FERRITIN, TIBC, IRON, RETICCTPCT in the last 72 hours. Sepsis Labs: Recent Labs  Lab 2018-08-30 1413 2018-08-30 1852  LATICACIDVEN 2.0* 1.4    Recent Results (from the past  240 hour(s))  Culture, blood (Routine x 2)     Status: None (Preliminary result)   Collection Time: 03-02-2018  2:13 PM  Result Value Ref Range Status   Specimen Description   Final    BLOOD RIGHT HAND Performed at [facility], [address], [place], [place] [address]    Special Requests   Final    BOTTLES DRAWN AEROBIC AND ANAEROBIC Blood Culture results may not be optimal due to an excessive volume of blood received in culture bottles Performed at [facility], [address], [place], [place] [address]    Culture   Final    NO GROWTH 2 DAYS Performed at [facility], [address], [place], [place] [address]    Report Status PENDING  Incomplete  Culture, blood (Routine x 2)     Status: None (Preliminary result)   Collection Time: 03-02-18  3:59 PM  Result Value Ref Range Status   Specimen Description   Final    BLOOD LEFT ANTECUBITAL Performed at [facility], [address] [place], [place] [address]    Special Requests   Final    BOTTLES DRAWN AEROBIC AND ANAEROBIC Blood Culture  adequate volume Performed at [facility], [address] [place], [place] [address]    Culture   Final    NO GROWTH 2 DAYS Performed at [facility], [address], [place], [place] [address]    Report Status PENDING  Incomplete     Radiology Studies: No results found.  Scheduled Meds: . amLODipine  5 mg Oral Daily  . azithromycin  500 mg Oral q1800  . donepezil  5 mg Oral QHS  . enoxaparin (LOVENOX) injection  40 mg Subcutaneous Q24H  . feeding supplement (ENSURE ENLIVE)  237 mL Oral TID BM  . FLUoxetine  10 mg Oral QHS  . FLUoxetine  40 mg Oral Daily  . fluticasone  1 spray Each Nare Daily  . folic acid  1 mg Oral Daily  . metoprolol tartrate  12.5 mg Oral BID  . predniSONE  5 mg Oral Q breakfast  . QUEtiapine  25 mg Oral QHS  . simvastatin  20 mg Oral QPM  . sodium chloride flush  3 mL Intravenous Once   Continuous Infusions: . ampicillin-sulbactam (UNASYN) IV 3 g (02/21/18 1605)     LOS: 2 days   Rickey Barbara, MD [facility] Pager On Amion  If 7PM-7AM, please contact night-coverage 02/21/2018, 6:27 PM

## 2018-02-22 ENCOUNTER — Inpatient Hospital Stay (HOSPITAL_COMMUNITY): Payer: Medicare Other

## 2018-02-22 ENCOUNTER — Encounter: Payer: Self-pay | Admitting: Nurse Practitioner

## 2018-02-22 DIAGNOSIS — E440 Moderate protein-calorie malnutrition: Secondary | ICD-10-CM

## 2018-02-22 DIAGNOSIS — A419 Sepsis, unspecified organism: Principal | ICD-10-CM

## 2018-02-22 DIAGNOSIS — Z515 Encounter for palliative care: Secondary | ICD-10-CM

## 2018-02-22 DIAGNOSIS — J189 Pneumonia, unspecified organism: Secondary | ICD-10-CM

## 2018-02-22 LAB — COMPREHENSIVE METABOLIC PANEL
ALT: 12 U/L (ref 0–44)
AST: 35 U/L (ref 15–41)
Albumin: 2.2 g/dL — ABNORMAL LOW (ref 3.5–5.0)
Alkaline Phosphatase: 78 U/L (ref 38–126)
Anion gap: 11 (ref 5–15)
BUN: 15 mg/dL (ref 8–23)
CO2: 23 mmol/L (ref 22–32)
CREATININE: 0.65 mg/dL (ref 0.44–1.00)
Calcium: 7.9 mg/dL — ABNORMAL LOW (ref 8.9–10.3)
Chloride: 113 mmol/L — ABNORMAL HIGH (ref 98–111)
GFR calc Af Amer: >60 mL/min (ref >60)
GFR calc non Af Amer: >60 mL/min (ref >60)
Glucose, Bld: 101 mg/dL — ABNORMAL HIGH (ref 70–99)
Potassium: 3.6 mmol/L (ref 3.5–5.1)
Sodium: 147 mmol/L — ABNORMAL HIGH (ref 135–145)
Total Bilirubin: 0.8 mg/dL (ref 0.3–1.2)
Total Protein: 6.4 g/dL — ABNORMAL LOW (ref 6.5–8.1)

## 2018-02-22 LAB — CBC
HCT: 37.6 % (ref 36.0–46.0)
Hemoglobin: 11 g/dL — ABNORMAL LOW (ref 12.0–15.0)
MCH: 27.9 pg (ref 26.0–34.0)
MCHC: 29.3 g/dL — AB (ref 30.0–36.0)
MCV: 95.4 fL (ref 80.0–100.0)
Platelets: 399 10*3/uL (ref 150–400)
RBC: 3.94 MIL/uL (ref 3.87–5.11)
RDW: 16.9 % — AB (ref 11.5–15.5)
WBC: 17.3 10*3/uL — ABNORMAL HIGH (ref 4.0–10.5)
nRBC: 0 % (ref 0.0–0.2)

## 2018-02-22 MED ORDER — SODIUM CHLORIDE 0.9 % IV SOLN
INTRAVENOUS | Status: DC
  Administered 2018-02-22 – 2018-02-23 (×2): via INTRAVENOUS

## 2018-02-22 MED ORDER — ALBUTEROL SULFATE (2.5 MG/3ML) 0.083% IN NEBU
2.5000 mg | INHALATION_SOLUTION | Freq: Four times a day (QID) | RESPIRATORY_TRACT | Status: DC | PRN
  Administered 2018-02-22 – 2018-02-23 (×2): 2.5 mg via RESPIRATORY_TRACT
  Filled 2018-02-22 (×2): qty 3

## 2018-02-22 MED ORDER — MORPHINE SULFATE (PF) 2 MG/ML IV SOLN
1.0000 mg | INTRAVENOUS | Status: DC | PRN
  Administered 2018-02-22 – 2018-02-23 (×4): 1 mg via INTRAVENOUS
  Filled 2018-02-22 (×4): qty 1

## 2018-02-22 MED ORDER — DEXTROSE 5 % IV SOLN
250.0000 mg | INTRAVENOUS | Status: DC
  Administered 2018-02-22: 250 mg via INTRAVENOUS
  Filled 2018-02-22 (×4): qty 250

## 2018-02-22 NOTE — Progress Notes (Signed)
PROGRESS NOTE    Tricia Daniel  ZOX:[id number] DOB: 09-Aug-1940 DOA: 02/24/2018 PCP: Anne Ng, NP    Brief Narrative:  78 y.o. female with medical history significant of dementia with sundowning, arthritis on chronic immunosuppressive tx, HLD, HTN, presents to ED with worsening sob and productive cough. Several weeks prior to admit, patient noted to have concerns of swallowing secretions and questionable aspiration. Pt is supposed to follow up with SLP eval this week. Patient was seen 3 days prior to visit where she was diagnosed with PNA on CXR and started on Levaquin. Symptoms worsened with increased sob, thus patient presented to ED for further evaluation  ED Course: In the ED, patient noted to have O2 sats in the 80's on room air, improved with 2LNC. CXR was performed with findings worrisome for worsening PNA despite recent abx tx. Patient was noted to have WBC of 12.2, K of 2.8, lactate of 2.0. Patient was given IVF bolus and started on empiric azithromycin and unasyn. Hospitalist consulted for consideration for admission.  Assessment & Plan:   Principal Problem:   Sepsis due to pneumonia [facility]) Active Problems:   Essential hypertension   Mixed hyperlipidemia   Dementia with behavioral disturbance (HCC)   Malnutrition of moderate degree   Pressure injury of skin   Palliative care encounter  1. Sepsis secondary to recurrent aspiration PNA 1. Presents with leukocytosis, tachycardia, elevated lactate with worsening PNA on personal review of CXR 2. Will continue on azithromycin with unasyn for aspiration coverage 3. Follow pan cultures 4. Currently stable on 2LNC, wean O2 as tolerated 5. Given concerns of aspiration, consulted SLP with recommendation for dysphagia 3 diet with thickened liquids 6. Concerns for worsening O2 requirements, up to 10LNC 7. Given worsening O2 requirements, have consulted Palliative Care, appreciate assistance. Plan for transition to comfort  measures with home with hospice.  8. Morphine added for pain and air hunger 2. HTN 1. Cont home meds as tolerated 2. Presently stable 3. HLD 1. Presently stable 2. Cont home meds as tolerated 4. Dementia 1. Stable at present 2. Per family, patient known to have marked sun-downing, presently stable 3. Will continue home anti-psychotics  4. Continued on PRN IV haldol. QTc reviewed, unremarkable 5. Acute hypoxemic respiratory failure 1. Per above, wean O2 as tolerated 2. O2 requirements have worsened to 10L high flow O2 3. Seen again by SLP with recs for continued dysphagia 3 diet and thickened liquids, however given recurrent aspiration, have kept NPO this AM 4. Appreciate input by Palliative Care per above. Plan to transition to hospice  DVT prophylaxis: Lovenox subQ Code Status: DNR Family Communication: Pt in room, family at bedside Disposition Plan: Uncertain at this time  Consultants:     Procedures:     Antimicrobials: Anti-infectives (From admission, onward)   Start     Dose/Rate Route Frequency Ordered Stop   02/22/18 1200  azithromycin (ZITHROMAX) 250 mg in dextrose 5 % 125 mL IVPB     250 mg 125 mL/hr over 60 Minutes Intravenous Every 24 hours 02/22/18 1023     02/21/18 1800  azithromycin (ZITHROMAX) tablet 500 mg  Status:  Discontinued     500 mg Oral Daily-1800 02/21/18 1040 02/22/18 1023   02/20/18 1800  azithromycin (ZITHROMAX) 500 mg in sodium chloride 0.9 % 250 mL IVPB  Status:  Discontinued     500 mg 250 mL/hr over 60 Minutes Intravenous Every 24 hours 02/13/2018 1805 02/21/18 1040   02/20/18 1500  Ampicillin-Sulbactam (UNASYN) 3  g in sodium chloride 0.9 % 100 mL IVPB     3 g 200 mL/hr over 30 Minutes Intravenous Every 6 hours 02/20/18 1438     02/26/2018 2359  Ampicillin-Sulbactam (UNASYN) 3 g in sodium chloride 0.9 % 100 mL IVPB  Status:  Discontinued     3 g 200 mL/hr over 30 Minutes Intravenous Every 8 hours 02/13/2018 1837 02/20/18 1438   02/28/2018 1600   Ampicillin-Sulbactam (UNASYN) 3 g in sodium chloride 0.9 % 100 mL IVPB     3 g 200 mL/hr over 30 Minutes Intravenous  Once 02/23/2018 1555 02/24/2018 1717   02/22/2018 1600  azithromycin (ZITHROMAX) 500 mg in sodium chloride 0.9 % 250 mL IVPB     500 mg 250 mL/hr over 60 Minutes Intravenous  Once 02/21/2018 1555 02/20/18 0659      Subjective: Patient herself without complaints, however concerns of aspiration noted this AM  Objective: Vitals:   02/22/18 0253 02/22/18 0620 02/22/18 1053 02/22/18 1311  BP:  (!) 148/59  (!) 151/69  Pulse:  93  92  Resp:  20  (!) 24  Temp: 99.3 F (37.4 C) 99.4 F (37.4 C)  99.3 F (37.4 C)  TempSrc: Oral Oral  Oral  SpO2:  90% 91% 91%  Weight:      Height:        Intake/Output Summary (Last 24 hours) at 02/22/2018 1704 Last data filed at 02/22/2018 1500 Gross per 24 hour  Intake 1365 ml  Output -  Net 1365 ml   Filed Weights   02/20/18 5868  Weight: 55.2 kg    Examination: General exam: Conversant, in no acute distress Respiratory system: coarse breath sounds R>L, increased resp effort Cardiovascular system: regular rhythm, s1-s2 Gastrointestinal system: Nondistended, nontender, pos BS Central nervous system: No seizures, no tremors Extremities: No cyanosis, no joint deformities Skin: No rashes, no pallor Psychiatry: Affect normal // no auditory hallucinations    Data Reviewed: I have personally reviewed following labs and imaging studies  CBC: Recent Labs  Lab 02/21/2018 1412 02/20/18 0553 02/21/18 0356 02/22/18 0442  WBC 12.2* 12.9* 11.6* 17.3*  NEUTROABS 9.2*  --   --   --   HGB 12.1 10.0* 10.2* 11.0*  HCT 40.0 33.9* 34.9* 37.6  MCV 96.4 98.8 98.0 95.4  PLT 359 280 338 399   Basic Metabolic Panel: Recent Labs  Lab 02/09/2018 1412 02/20/18 0553 02/21/18 0356 02/22/18 0442  NA 139 141 144 147*  K 2.8* 3.7 3.4* 3.6  CL 110 112* 114* 113*  CO2 20* 20* 23 23  GLUCOSE 170* 112* 111* 101*  BUN 23 17 15 15   CREATININE 0.82  0.61 0.65 0.65  CALCIUM 8.2* 7.8* 7.9* 7.9*  MG 1.9  --   --   --    GFR: Estimated Creatinine Clearance: 45.2 mL/min (by C-G formula based on SCr of 0.65 mg/dL). Liver Function Tests: Recent Labs  Lab 02/13/2018 1412 02/20/18 0553 02/22/18 0442  AST 33 26 35  ALT 15 13 12   ALKPHOS 72 52 78  BILITOT 0.5 0.6 0.8  PROT 7.1 6.0* 6.4*  ALBUMIN 2.9* 2.2* 2.2*   No results for input(s): LIPASE, AMYLASE in the last 168 hours. No results for input(s): AMMONIA in the last 168 hours. Coagulation Profile: Recent Labs  Lab 03/05/2018 1412  INR 1.10   Cardiac Enzymes: No results for input(s): CKTOTAL, CKMB, CKMBINDEX, TROPONINI in the last 168 hours. BNP (last 3 results) No results for input(s): PROBNP in the last  8760 hours. HbA1C: No results for input(s): HGBA1C in the last 72 hours. CBG: No results for input(s): GLUCAP in the last 168 hours. Lipid Profile: No results for input(s): CHOL, HDL, LDLCALC, TRIG, CHOLHDL, LDLDIRECT in the last 72 hours. Thyroid Function Tests: No results for input(s): TSH, T4TOTAL, FREET4, T3FREE, THYROIDAB in the last 72 hours. Anemia Panel: No results for input(s): VITAMINB12, FOLATE, FERRITIN, TIBC, IRON, RETICCTPCT in the last 72 hours. Sepsis Labs: Recent Labs  Lab 03/06/2018 1413 02/27/2018 1852  LATICACIDVEN 2.0* 1.4    Recent Results (from the past 240 hour(s))  Culture, blood (Routine x 2)     Status: None (Preliminary result)   Collection Time: 02/09/2018  2:13 PM  Result Value Ref Range Status   Specimen Description   Final    BLOOD RIGHT HAND Performed at [facility], [address] [place], [place] [address]    Special Requests   Final    BOTTLES DRAWN AEROBIC AND ANAEROBIC Blood Culture results may not be optimal due to an excessive volume of blood received in culture bottles Performed at [facility], [address] [place], [place] [address]    Culture   Final    NO GROWTH 3 DAYS Performed  at [facility], [address] [place], [place] [address]    Report Status PENDING  Incomplete  Culture, blood (Routine x 2)     Status: None (Preliminary result)   Collection Time: 02/26/2018  3:59 PM  Result Value Ref Range Status   Specimen Description   Final    BLOOD LEFT ANTECUBITAL Performed at [facility], [address] [place], [place] [address]    Special Requests   Final    BOTTLES DRAWN AEROBIC AND ANAEROBIC Blood Culture adequate volume Performed at [facility], [address] [place], [place] [address]    Culture   Final    NO GROWTH 3 DAYS Performed at [facility], [address] [place], [place] [address]    Report Status PENDING  Incomplete     Radiology Studies: Dg Chest Port 1 View  Result Date: 02/22/2018 CLINICAL DATA:  Follow-up aspiration pneumonia. Shortness of breath. EXAM: PORTABLE CHEST 1 VIEW COMPARISON:  03/05/2018 and earlier. FINDINGS: Interval worsening of the airspace opacities in the RIGHT LOWER LOBE, LEFT LOWER LOBE and RIGHT UPPER LOBE. No new disease. Possible small BILATERAL effusions. Cardiac silhouette upper normal in size to slightly enlarged for AP portable technique. Pulmonary vascularity normal. Severe degenerative changes in both shoulders with modeling of the humeral head and glenoid bilaterally. IMPRESSION: Worsening multilobar BILATERAL pneumonia. Electronically Signed   By: Hulan Saashomas  Lawrence M.D.   On: 02/22/2018 09:03    Scheduled Meds: . amLODipine  5 mg Oral Daily  . donepezil  5 mg Oral QHS  . enoxaparin (LOVENOX) injection  40 mg Subcutaneous Q24H  . feeding supplement (ENSURE ENLIVE)  237 mL Oral TID BM  . FLUoxetine  10 mg Oral QHS  . FLUoxetine  40 mg Oral Daily  . fluticasone  1 spray Each Nare Daily  . folic acid  1 mg Oral Daily  . metoprolol tartrate  12.5 mg Oral BID  . predniSONE  5 mg Oral Q breakfast  . QUEtiapine  25 mg Oral QHS  . simvastatin  20 mg Oral  QPM  . sodium chloride flush  3 mL Intravenous Once   Continuous Infusions: . sodium chloride 75 mL/hr at 02/22/18  1305  . ampicillin-sulbactam (UNASYN) IV 3 g (02/22/18 1548)  . azithromycin Stopped (02/22/18 1408)     LOS: 3 days   Rickey BarbaraStephen Jewelle Whitner, MD [facility] Pager On Amion  If 7PM-7AM, please contact night-coverage 02/22/2018, 5:04 PM

## 2018-02-22 NOTE — Progress Notes (Deleted)
   Patient discharging back to [facility]. Confirmed ability to return and sent required documents. [facility] has been called for transport. RN to call report [phone].   Patient's nephew to be updated.   Cailie Bosshart, LCSW [facility] [phone] 

## 2018-02-22 NOTE — Consult Note (Signed)
Palliative Medicine    Name: Tricia Daniel Date: 02/22/2018 MRN: [id number]  DOB: March 24, 1940  Patient Care Team: Flossie Buffy, NP as PCP - General (Internal Medicine)    REASON FOR CONSULTATION: Palliative Care consult requested for this 78 y.o. female with multiple medical problems including dementia, arthritis on chronic immunosuppressive therapy, hypertension, and hyperlipidemia, who was admitted on 02/20/2018 with hypoxic respiratory failure after failing outpatient treatment for pneumonia.  Chest x-ray revealed bilateral infiltrates.  Patient was felt to have some component of aspiration and has been evaluated by speech therapy.  Diet was modified but patient was felt to have re-aspirated and respiratory status has further declined.  Repeat chest x-ray on 02/22/2018 reveals worsening multilobar bilateral pneumonia.  Palliative care was consulted to help address goals.  SOCIAL HISTORY:    Patient is widowed.  She lives at home with her daughter and son-in-law.  Patient has another daughter who lives next door.  ADVANCE DIRECTIVES:  Not on file  CODE STATUS: DNR  PAST MEDICAL HISTORY: Past Medical History:  Diagnosis Date  . Allergy   . Arthritis   . Chronic kidney disease   . Dementia ([place])   . Depression   . Gait abnormality 04/04/2017  . Hyperlipidemia   . Hypertension     PAST SURGICAL HISTORY:  Past Surgical History:  Procedure Laterality Date  . COLOSTOMY  1988  . COLOSTOMY REVERSAL  1999  . nephrectomy Left 1988  . TONSILLECTOMY      HEMATOLOGY/ONCOLOGY HISTORY:   No history exists.    ALLERGIES:  is allergic to latex.  MEDICATIONS:  Current Facility-Administered Medications  Medication Dose Route Frequency Provider Last Rate Last Dose  . 0.9 %  sodium chloride infusion   Intravenous Continuous Donne Hazel, MD 75 mL/hr at 02/22/18 1305    . acetaminophen (TYLENOL) tablet 650 mg  650 mg Oral Q6H PRN Donne Hazel, MD   650 mg at 02/21/18  1944   Or  . acetaminophen (TYLENOL) suppository 650 mg  650 mg Rectal Q6H PRN Donne Hazel, MD      . amLODipine (NORVASC) tablet 5 mg  5 mg Oral Daily Donne Hazel, MD   5 mg at 02/21/18 1027  . Ampicillin-Sulbactam (UNASYN) 3 g in sodium chloride 0.9 % 100 mL IVPB  3 g Intravenous Q6H Lenis Noon, RPH 200 mL/hr at 02/22/18 1010 3 g at 02/22/18 1010  . azithromycin (ZITHROMAX) 250 mg in dextrose 5 % 125 mL IVPB  250 mg Intravenous Q24H Donne Hazel, MD 125 mL/hr at 02/22/18 1307 250 mg at 02/22/18 1307  . benzonatate (TESSALON) capsule 100 mg  100 mg Oral TID PRN Donne Hazel, MD      . donepezil (ARICEPT) tablet 5 mg  5 mg Oral QHS Donne Hazel, MD   5 mg at 02/21/18 2128  . enoxaparin (LOVENOX) injection 40 mg  40 mg Subcutaneous Q24H Donne Hazel, MD   40 mg at 02/21/18 2132  . feeding supplement (ENSURE ENLIVE) (ENSURE ENLIVE) liquid 237 mL  237 mL Oral TID BM Donne Hazel, MD   237 mL at 02/21/18 1944  . FLUoxetine (PROZAC) capsule 10 mg  10 mg Oral QHS Donne Hazel, MD   10 mg at 02/21/18 2128  . FLUoxetine (PROZAC) capsule 40 mg  40 mg Oral Daily Donne Hazel, MD   40 mg at 02/21/18 1027  . fluticasone (FLONASE) 50 MCG/ACT nasal spray  1 spray  1 spray Each Nare Daily Donne Hazel, MD   1 spray at 76/16/07 3710  . folic acid (FOLVITE) tablet 1 mg  1 mg Oral Daily Donne Hazel, MD   1 mg at 02/21/18 1028  . haloperidol lactate (HALDOL) injection 2 mg  2 mg Intravenous Q6H PRN Donne Hazel, MD      . lactulose (Mayo) 10 GM/15ML solution 10 g  10 g Oral BID PRN Donne Hazel, MD      . metoprolol tartrate (LOPRESSOR) tablet 12.5 mg  12.5 mg Oral BID Donne Hazel, MD   12.5 mg at 02/21/18 2128  . morphine 2 MG/ML injection 1 mg  1 mg Intravenous Q2H PRN Donne Hazel, MD      . predniSONE (DELTASONE) tablet 5 mg  5 mg Oral Q breakfast Donne Hazel, MD   5 mg at 02/21/18 1027  . QUEtiapine (SEROQUEL) tablet 25 mg  25 mg Oral QHS Donne Hazel, MD   25 mg at 02/21/18 2128  . RESOURCE THICKENUP CLEAR   Oral PRN Donne Hazel, MD      . simvastatin (ZOCOR) tablet 20 mg  20 mg Oral QPM Donne Hazel, MD   20 mg at 02/21/18 1815  . sodium chloride flush (NS) 0.9 % injection 3 mL  3 mL Intravenous Once Sherwood Gambler, MD        VITAL SIGNS: BP (!) 151/69 (BP Location: Left Arm)   Pulse 92   Temp 99.3 F (37.4 C) (Oral)   Resp (!) 24   Ht 5' (1.524 m)   Wt 55.2 kg   SpO2 91%   BMI 23.77 kg/m  Filed Weights   02/20/18 0609  Weight: 55.2 kg    Estimated body mass index is 23.77 kg/m as calculated from the following:   Height as of this encounter: 5' (1.524 m).   Weight as of this encounter: 55.2 kg.  LABS: CBC:    Component Value Date/Time   WBC 17.3 (H) 02/22/2018 0442   HGB 11.0 (L) 02/22/2018 0442   HGB 12.8 02/03/2018 0938   HCT 37.6 02/22/2018 0442   HCT 40.9 02/03/2018 0938   PLT 399 02/22/2018 0442   PLT 203 02/03/2018 0938   MCV 95.4 02/22/2018 0442   MCV 93 02/03/2018 0938   NEUTROABS 9.2 (H) 03/04/2018 1412   NEUTROABS 8.2 (H) 02/03/2018 0938   LYMPHSABS 1.3 02/16/2018 1412   LYMPHSABS 1.2 02/03/2018 0938   MONOABS 1.3 (H) 02/24/2018 1412   EOSABS 0.3 02/18/2018 1412   EOSABS 0.3 02/03/2018 0938   BASOSABS 0.0 02/14/2018 1412   BASOSABS 0.0 02/03/2018 0938   Comprehensive Metabolic Panel:    Component Value Date/Time   NA 147 (H) 02/22/2018 0442   NA 145 (H) 02/03/2018 0938   K 3.6 02/22/2018 0442   CL 113 (H) 02/22/2018 0442   CO2 23 02/22/2018 0442   BUN 15 02/22/2018 0442   BUN 28 (H) 02/03/2018 0938   CREATININE 0.65 02/22/2018 0442   GLUCOSE 101 (H) 02/22/2018 0442   CALCIUM 7.9 (L) 02/22/2018 0442   AST 35 02/22/2018 0442   ALT 12 02/22/2018 0442   ALKPHOS 78 02/22/2018 0442   BILITOT 0.8 02/22/2018 0442   BILITOT 0.5 02/03/2018 0938   PROT 6.4 (L) 02/22/2018 0442   PROT 6.5 02/03/2018 0938   ALBUMIN 2.2 (L) 02/22/2018 0442   ALBUMIN 3.8 02/03/2018 0938     RADIOGRAPHIC STUDIES:  Dg Chest 2 View  Result Date: 02/07/2018 CLINICAL DATA:  Pt diagnosed with pna on Monday. Antibiotics at home. Not improving. Coughing. Shortness of breath. Hx HTN, CKD. EXAM: CHEST - 2 VIEW COMPARISON:  02/16/2018 FINDINGS: Heart size is accentuated by AP technique. Increased airspace filling opacities are identified in the RIGHT UPPER lobe and LEFT perihilar region compared to prior study. Chronic perihilar peribronchial thickening. No pulmonary edema. Convex RIGHT scoliosis of the thoracic spine. IMPRESSION: Increased bilateral infiltrates. Electronically Signed   By: Nolon Nations M.D.   On: 02/14/2018 14:29   Dg Chest 2 View  Result Date: 02/16/2018 CLINICAL DATA:  78 year old female with cough and low-grade fever EXAM: CHEST - 2 VIEW COMPARISON:  Prior chest x-ray 02/06/2018 FINDINGS: Cardiac and mediastinal contours are within normal limits. Mild atherosclerotic calcifications present in the transverse aorta. Small volume patchy airspace opacity in the right mid lung is a new finding and concerning for right lower lobe bronchopneumonia. Stable chronic bronchitic changes and peripheral reticular interstitial prominence. No pleural effusion or pneumothorax. Advanced bilateral glenohumeral joint osteoarthritis. No acute fracture or malalignment. IMPRESSION: New patchy right lower lobe airspace opacity concerning for bronchopneumonia. Electronically Signed   By: Jacqulynn Cadet M.D.   On: 02/16/2018 14:59   Ct Head Wo Contrast  Result Date: 02/06/2018 CLINICAL DATA:  Onset slurred speech and left eye drooping last night at 8 p.m. EXAM: CT HEAD WITHOUT CONTRAST TECHNIQUE: Contiguous axial images were obtained from the base of the skull through the vertex without intravenous contrast. COMPARISON:  None. FINDINGS: Brain: No evidence of acute infarction, hemorrhage, hydrocephalus, extra-axial collection or mass lesion/mass effect. Atrophy and chronic microvascular ischemic  change noted. Vascular: No hyperdense vessel or unexpected calcification. Skull: Intact.  No focal lesion. Sinuses/Orbits: Negative. Other: None. IMPRESSION: No acute abnormality. Atrophy and chronic microvascular ischemic change. Electronically Signed   By: Inge Rise M.D.   On: 02/06/2018 13:31   Mr Brain Wo Contrast  Result Date: 02/06/2018 CLINICAL DATA:  Slurred speech, LEFT facial droop beginning last night. History of dementia, hypertension and hyperlipidemia. EXAM: MRI HEAD WITHOUT CONTRAST TECHNIQUE: Multiplanar, multiecho pulse sequences of the brain and surrounding structures were obtained without intravenous contrast. COMPARISON:  CT HEAD February 06, 2018 and MRI head April 18, 2017. FINDINGS: Moderately motion degraded sequences after multiple attempts. INTRACRANIAL CONTENTS: No reduced diffusion to suggest acute ischemia. No susceptibility artifact to suggest hemorrhage. RIGHT cerebellar suspected developmental venous anomaly. Patchy supratentorial white matter T2 hyperintensities compatible. No advanced parenchymal brain volume loss for age. No hydrocephalus. No suspicious parenchymal signal, masses, mass effect. No abnormal extra-axial fluid collections. No extra-axial masses. VASCULAR: Normal major intracranial vascular flow voids present at skull base. SKULL AND UPPER CERVICAL SPINE: No abnormal sellar expansion. No suspicious calvarial bone marrow signal. Craniocervical junction maintained. SINUSES/ORBITS: The mastoid air-cells and included paranasal sinuses are well-aerated.The included ocular globes and orbital contents are non-suspicious. OTHER: Patient is edentulous. IMPRESSION: 1. Moderately motion degraded examination. No acute intracranial process. 2. Moderate chronic small vessel ischemic changes. Electronically Signed   By: Elon Alas M.D.   On: 02/06/2018 15:05   Dg Chest Port 1 View  Result Date: 02/22/2018 CLINICAL DATA:  Follow-up aspiration pneumonia. Shortness  of breath. EXAM: PORTABLE CHEST 1 VIEW COMPARISON:  02/23/2018 and earlier. FINDINGS: Interval worsening of the airspace opacities in the RIGHT LOWER LOBE, LEFT LOWER LOBE and RIGHT UPPER LOBE. No new disease. Possible small BILATERAL effusions. Cardiac silhouette upper normal in size to slightly enlarged for  AP portable technique. Pulmonary vascularity normal. Severe degenerative changes in both shoulders with modeling of the humeral head and glenoid bilaterally. IMPRESSION: Worsening multilobar BILATERAL pneumonia. Electronically Signed   By: Evangeline Dakin M.D.   On: 02/22/2018 09:03   Dg Chest Portable 1 View  Result Date: 02/06/2018 CLINICAL DATA:  Episode of difficulty swallowing yesterday. Question aspiration. EXAM: PORTABLE CHEST 1 VIEW COMPARISON:  PA and lateral chest 09/09/2017. FINDINGS: Mild left basilar atelectasis or scar is unchanged. Lungs otherwise clear. Heart size normal. No pneumothorax or pleural effusion. No acute bony abnormality. Severe bilateral glenohumeral osteoarthritis noted. IMPRESSION: Negative for aspiration.  No acute disease. Electronically Signed   By: Inge Rise M.D.   On: 02/06/2018 13:10    PERFORMANCE STATUS (ECOG) : 4 - Bedbound  Review of Systems As noted above. Otherwise, a complete review of systems is negative.  Physical Exam General: Frail, ill-appearing Pulmonary: Audibly congested, on 10 L O2 Extremities: no edema, no joint deformities Skin: no rashes Neurological: Weakness but otherwise nonfocal  IMPRESSION: Patient pleasantly confused.  She says she is feeling better despite clinical decline over the past 24 hours.  I met privately with patient's two daughters.  Daughters describe a pattern of decline since November 2019.  They had noted patient becoming increasingly weak at home with poor oral intake and weight loss.  Patient then became essentially chair/bedbound.  We reviewed patient's current medical problems.  Both daughters feel  patient is likely approaching end-of-life.  We talked about option of continued treatment but that patient is at risk of further decline.  We also talked about the option of transitioning to less aggressive measures such as comfort care.  Ultimately daughters say their primary goal would be to get patient home and keep her comfortable there.  They had promised their mother that she would have the option of dying at home.  Daughters are interested in getting patient home with hospice following.  They would consider option of residential hospice in the event that care became too great to manage at home.  Daughters confirmed DNR.  Patient has previously verbalized her wishes for end-of-life care.  Daughter say that patient would not want a feeding tube.  Daughters are okay with continuing the current scope of treatment for now until hospice can be arranged at home.  They do not want to escalate care such as high flow nasal cannula or BiPAP.  They said they would be okay with transitioning to comfort care in the event that she declines.  Case discussed with Dr. Wyline Copas.  PLAN: -Care management consult to coordinate hospice care at home -Continue current scope of treatment until hospice can be arranged -Recommend comfort care in the event of further clinical decline -Agree with morphine for pain/dyspnea -DNR   Time Total: 50 minutes  Visit consisted of counseling and education dealing with the complex and emotionally intense issues of symptom management and palliative care in the setting of serious and potentially life-threatening illness.Greater than 50%  of this time was spent counseling and coordinating care related to the above assessment and plan.  Signed by: Altha Harm, PhD, NP-C [phone] (Work Cell)

## 2018-02-22 NOTE — Progress Notes (Signed)
While attempting to clean patient, noted that she had eggs in her mouth. Provided oral care and reinforced speech education with family at bedside on swallow strategies and checking for residual in mouth. Patient is currently on 8L [place], increase from previous day. Dr. Rhona Leavens notified of findings. Patient is now NPO, family and patient educated. Will continue to monitor and wait for orders. Cont. Pulse Ox ordered for patient.

## 2018-02-22 NOTE — Clinical Social Work Note (Signed)
Clinical Social Work Assessment  Patient Details  Name: Tricia Daniel MRN: [id number] Date of Birth: 07-Oct-1940  Date of referral:  02/22/18               Reason for consult:  Facility Placement                Permission sought to share information with:    Permission granted to share information::  Yes, Verbal Permission Granted  Name::     Eduardo Osier  Agency::     Relationship::     Contact Information:     Housing/Transportation Living arrangements for the past 2 months:  Single Family Home Source of Information:  Adult Children Patient Interpreter Needed:  None Criminal Activity/Legal Involvement Pertinent to Current Situation/Hospitalization:  No - Comment as needed Significant Relationships:  Adult Children Lives with:  Adult Children Do you feel safe going back to the place where you live?  Yes Need for family participation in patient care:  Yes (Comment)  Care giving concerns:  Social Work referred to this patient for assistance with SNF placement per  PT recommendation. Patient's daughter in room during visit who discussed medical recommendation for Palliative Care.    Social Worker assessment / plan:  CSW met with patient and daughter at bedside. Per patient's daughter, she does not want SNF at this time and has to decided to take patient home with palliative care. Per patient's daughter, she and her husband moved in with patient following the death of patient's spouse 6 years ago to provide care. They would like patient to be home where she is most comfortable.   Employment status:  Retired Forensic scientist:  Other (Comment Required)([employer]) PT Recommendations:  [place] / Referral to community resources:     Patient/Family's Response to care: Patient and patient's daughter, pleasant and engaging.  Patient/Family's Understanding of and Emotional Response to Diagnosis, Current Treatment, and Prognosis:  Patient and patient's  daughter decline SNF at this time and are agreeable to discharge patient home with palliative care.  Emotional Assessment Appearance:  Appears stated age Attitude/Demeanor/Rapport:  Lethargic Affect (typically observed):  Accepting Orientation:  Oriented to Self, Oriented to Place, Oriented to  Time, Oriented to Situation Alcohol / Substance use:    Psych involvement (Current and /or in the community):  No (Comment)  Discharge Needs  Concerns to be addressed:  No discharge needs identified Readmission within the last 30 days:  No Current discharge risk:  None Barriers to Discharge:  No Barriers Identified   Ardean Larsen, [place] 02/22/2018, 1:18 PM

## 2018-02-23 ENCOUNTER — Ambulatory Visit: Payer: Medicare Other | Admitting: Physical Therapy

## 2018-02-23 ENCOUNTER — Ambulatory Visit: Payer: Medicare Other | Admitting: Nurse Practitioner

## 2018-02-23 DIAGNOSIS — Z7189 Other specified counseling: Secondary | ICD-10-CM

## 2018-02-23 DIAGNOSIS — T17908D Unspecified foreign body in respiratory tract, part unspecified causing other injury, subsequent encounter: Secondary | ICD-10-CM

## 2018-02-23 MED ORDER — LORAZEPAM 2 MG/ML IJ SOLN
1.0000 mg | INTRAMUSCULAR | Status: DC | PRN
  Administered 2018-02-23 – 2018-02-25 (×2): 1 mg via INTRAVENOUS
  Filled 2018-02-23 (×2): qty 1

## 2018-02-23 MED ORDER — GLYCOPYRROLATE 0.2 MG/ML IJ SOLN
0.2000 mg | INTRAMUSCULAR | Status: DC | PRN
  Administered 2018-02-24: 0.2 mg via INTRAVENOUS
  Filled 2018-02-23 (×3): qty 1

## 2018-02-23 MED ORDER — HYDROMORPHONE HCL 1 MG/ML IJ SOLN
0.5000 mg | INTRAMUSCULAR | Status: DC | PRN
  Administered 2018-02-23 – 2018-02-25 (×14): 0.5 mg via INTRAVENOUS
  Filled 2018-02-23 (×14): qty 0.5

## 2018-02-23 MED ORDER — LORAZEPAM 2 MG/ML IJ SOLN: 0.5000 mg | INTRAMUSCULAR | Status: DC | PRN

## 2018-02-23 NOTE — Progress Notes (Signed)
OT Cancellation Note  Patient Details Name: Tricia Daniel MRN: [id number] DOB: [birth year]   Cancelled Treatment:     PER NURSING HOSPICE WILL BE COMING TO TALK TO PATIENT TODAY. WILL HOLD THERAPY.   Detrich Rakestraw 02/23/2018, 10:02 AM

## 2018-02-23 NOTE — Progress Notes (Signed)
PROGRESS NOTE    Tricia Daniel  LKG:[id number] DOB: June 27, 1940 DOA: 03-16-2018 PCP: Anne Ng, NP    Brief Narrative:  78 y.o. female with medical history significant of dementia with sundowning, arthritis on chronic immunosuppressive tx, HLD, HTN, presents to ED with worsening sob and productive cough. Several weeks prior to admit, patient noted to have concerns of swallowing secretions and questionable aspiration. Pt is supposed to follow up with SLP eval this week. Patient was seen 3 days prior to visit where she was diagnosed with PNA on CXR and started on Levaquin. Symptoms worsened with increased sob, thus patient presented to ED for further evaluation  ED Course: In the ED, patient noted to have O2 sats in the 80's on room air, improved with 2LNC. CXR was performed with findings worrisome for worsening PNA despite recent abx tx. Patient was noted to have WBC of 12.2, K of 2.8, lactate of 2.0. Patient was given IVF bolus and started on empiric azithromycin and unasyn. Hospitalist consulted for consideration for admission.  Assessment & Plan:   Principal Problem:   Sepsis due to pneumonia [facility]) Active Problems:   Essential hypertension   Mixed hyperlipidemia   Dementia with behavioral disturbance (HCC)   Malnutrition of moderate degree   Pressure injury of skin   Palliative care encounter  1. Sepsis secondary to recurrent aspiration PNA 1. Presents with leukocytosis, tachycardia, elevated lactate with worsening PNA on personal review of CXR 2. Will continue on azithromycin with unasyn for aspiration coverage 3. Follow pan cultures 4. Currently stable on 2LNC, wean O2 as tolerated 5. Given concerns of aspiration, consulted SLP with recommendation for dysphagia 3 diet with thickened liquids 6. Continued worsening of O2 requirements, up to NRB today 7. Appreciate input by Palliative Care. Concern that patient is now actively dying. Ativan PRN added. Transition to focus on  comfort 8. Little improvement with morphine PRN. Per Palliative Care, transition to PRN dilaudid 2. HTN 1. Now on comfort only measures per above 3. HLD 1. Had been stable. Now on comfort measures 4. Dementia 1. Cont with comfort measures 5. Acute hypoxemic respiratory failure 1. O2 requirements have worsened to NRB 2. Cont O2 for comfort  DVT prophylaxis: Lovenox subQ Code Status: DNR Family Communication: Pt in room, family at bedside Disposition Plan: Uncertain at this time  Consultants:     Procedures:     Antimicrobials: Anti-infectives (From admission, onward)   Start     Dose/Rate Route Frequency Ordered Stop   02/22/18 1200  azithromycin (ZITHROMAX) 250 mg in dextrose 5 % 125 mL IVPB  Status:  Discontinued     250 mg 125 mL/hr over 60 Minutes Intravenous Every 24 hours 02/22/18 1023 02/23/18 1203   02/21/18 1800  azithromycin (ZITHROMAX) tablet 500 mg  Status:  Discontinued     500 mg Oral Daily-1800 02/21/18 1040 02/22/18 1023   02/20/18 1800  azithromycin (ZITHROMAX) 500 mg in sodium chloride 0.9 % 250 mL IVPB  Status:  Discontinued     500 mg 250 mL/hr over 60 Minutes Intravenous Every 24 hours 03/16/2018 1805 02/21/18 1040   02/20/18 1500  Ampicillin-Sulbactam (UNASYN) 3 g in sodium chloride 0.9 % 100 mL IVPB  Status:  Discontinued     3 g 200 mL/hr over 30 Minutes Intravenous Every 6 hours 02/20/18 1438 02/23/18 1203   March 16, 2018 2359  Ampicillin-Sulbactam (UNASYN) 3 g in sodium chloride 0.9 % 100 mL IVPB  Status:  Discontinued     3 g 200  mL/hr over 30 Minutes Intravenous Every 8 hours 03-02-18 1837 02/20/18 1438   03-02-18 1600  Ampicillin-Sulbactam (UNASYN) 3 g in sodium chloride 0.9 % 100 mL IVPB     3 g 200 mL/hr over 30 Minutes Intravenous  Once 03-02-18 1555 2018/03/02 1717   02-Mar-2018 1600  azithromycin (ZITHROMAX) 500 mg in sodium chloride 0.9 % 250 mL IVPB     500 mg 250 mL/hr over 60 Minutes Intravenous  Once 2018-03-02 1555 02/20/18 0659       Subjective: Complained of worsened sob this AM  Objective: Vitals:   02/22/18 1742 02/22/18 2207 02/23/18 0617 02/23/18 1118  BP:  (!) 145/70 (!) 145/68   Pulse:  92 98   Resp:  20 18   Temp:  99 F (37.2 C) 97.9 F (36.6 C)   TempSrc:  Oral    SpO2: (!) 89% 92% 91% (!) 85%  Weight:      Height:        Intake/Output Summary (Last 24 hours) at 02/23/2018 1544 Last data filed at 02/23/2018 1400 Gross per 24 hour  Intake 800.67 ml  Output 1825 ml  Net -1024.33 ml   Filed Weights   02/20/18 0609  Weight: 55.2 kg    Examination: General exam: Awake, laying in bed, appears sob Respiratory system: Increased respiratory effort, coarse breath sounds Cardiovascular system: tachycardic, s1, s2 Gastrointestinal system: Soft, nondistended, positive BS Central nervous system: CN2-12 grossly intact, strength intact Extremities: Perfused, no clubbing Skin: Normal skin turgor, no notable skin lesions seen Psychiatry: Mood normal // no visual hallucinations    Data Reviewed: I have personally reviewed following labs and imaging studies  CBC: Recent Labs  Lab 2018-03-02 1412 02/20/18 0553 02/21/18 0356 02/22/18 0442  WBC 12.2* 12.9* 11.6* 17.3*  NEUTROABS 9.2*  --   --   --   HGB 12.1 10.0* 10.2* 11.0*  HCT 40.0 33.9* 34.9* 37.6  MCV 96.4 98.8 98.0 95.4  PLT 359 280 338 399   Basic Metabolic Panel: Recent Labs  Lab 2018/03/02 1412 02/20/18 0553 02/21/18 0356 02/22/18 0442  NA 139 141 144 147*  K 2.8* 3.7 3.4* 3.6  CL 110 112* 114* 113*  CO2 20* 20* 23 23  GLUCOSE 170* 112* 111* 101*  BUN 23 17 15 15   CREATININE 0.82 0.61 0.65 0.65  CALCIUM 8.2* 7.8* 7.9* 7.9*  MG 1.9  --   --   --    GFR: Estimated Creatinine Clearance: 45.2 mL/min (by C-G formula based on SCr of 0.65 mg/dL). Liver Function Tests: Recent Labs  Lab 03-02-2018 1412 02/20/18 0553 02/22/18 0442  AST 33 26 35  ALT 15 13 12   ALKPHOS 72 52 78  BILITOT 0.5 0.6 0.8  PROT 7.1 6.0* 6.4*  ALBUMIN  2.9* 2.2* 2.2*   No results for input(s): LIPASE, AMYLASE in the last 168 hours. No results for input(s): AMMONIA in the last 168 hours. Coagulation Profile: Recent Labs  Lab 03-02-2018 1412  INR 1.10   Cardiac Enzymes: No results for input(s): CKTOTAL, CKMB, CKMBINDEX, TROPONINI in the last 168 hours. BNP (last 3 results) No results for input(s): PROBNP in the last 8760 hours. HbA1C: No results for input(s): HGBA1C in the last 72 hours. CBG: No results for input(s): GLUCAP in the last 168 hours. Lipid Profile: No results for input(s): CHOL, HDL, LDLCALC, TRIG, CHOLHDL, LDLDIRECT in the last 72 hours. Thyroid Function Tests: No results for input(s): TSH, T4TOTAL, FREET4, T3FREE, THYROIDAB in the last 72 hours. Anemia  Panel: No results for input(s): VITAMINB12, FOLATE, FERRITIN, TIBC, IRON, RETICCTPCT in the last 72 hours. Sepsis Labs: Recent Labs  Lab 02/28/2018 1413 02/17/2018 1852  LATICACIDVEN 2.0* 1.4    Recent Results (from the past 240 hour(s))  Culture, blood (Routine x 2)     Status: None (Preliminary result)   Collection Time: 02/14/2018  2:13 PM  Result Value Ref Range Status   Specimen Description   Final    BLOOD RIGHT HAND Performed at [facility], [address] [place], [place] [address]    Special Requests   Final    BOTTLES DRAWN AEROBIC AND ANAEROBIC Blood Culture results may not be optimal due to an excessive volume of blood received in culture bottles Performed at [facility], [address] [place], [place] [address]    Culture   Final    NO GROWTH 4 DAYS Performed at [facility], [address] [place], [place] [address]    Report Status PENDING  Incomplete  Culture, blood (Routine x 2)     Status: None (Preliminary result)   Collection Time: 02/15/2018  3:59 PM  Result Value Ref Range Status   Specimen Description   Final    BLOOD LEFT ANTECUBITAL Performed at [facility], [address] [place], [place] [address]    Special Requests   Final    BOTTLES DRAWN AEROBIC AND ANAEROBIC Blood Culture adequate volume Performed at [facility], [address] [place], [place] [address]    Culture   Final    NO GROWTH 4 DAYS Performed at [facility], [address] [place], [place] [address]    Report Status PENDING  Incomplete     Radiology Studies: Dg Chest Port 1 View  Result Date: 02/22/2018 CLINICAL DATA:  Follow-up aspiration pneumonia. Shortness of breath. EXAM: PORTABLE CHEST 1 VIEW COMPARISON:  03/06/2018 and earlier. FINDINGS: Interval worsening of the airspace opacities in the RIGHT LOWER LOBE, LEFT LOWER LOBE and RIGHT UPPER LOBE. No new disease. Possible small BILATERAL effusions. Cardiac silhouette upper normal in size to slightly enlarged for AP portable technique. Pulmonary vascularity normal. Severe degenerative changes in both shoulders with modeling of the humeral head and glenoid bilaterally. IMPRESSION: Worsening multilobar BILATERAL pneumonia. Electronically Signed   By: Hulan Saashomas  Lawrence M.D.   On: 02/22/2018 09:03    Scheduled Meds: . sodium chloride flush  3 mL Intravenous Once   Continuous Infusions:    LOS: 4 days   Rickey BarbaraStephen Kaida Games, MD [facility] Pager On Amion  If 7PM-7AM, please contact night-coverage 02/23/2018, 3:44 PM

## 2018-02-23 NOTE — Progress Notes (Signed)
Daily Progress Note   Patient Name: Tricia Daniel       Date: 02/23/2018 DOB: Sep 14, 1940  Age: 78 y.o. MRN#: [id number] Attending Physician: Donne Hazel, MD Primary Care Physician: Flossie Buffy, NP Admit Date: 02/18/2018  Reason for Consultation/Follow-up: Establishing goals of care, Non pain symptom management and Terminal Care  Subjective: Ms. Frances developed worsening of shortness of breath this AM.  I met today with her daughters in conjunction with Dr. Wyline Copas.  Goal has been to work to transition home with the support of hospice, but unfortunately her condition has worsened and she is now acutely short of breath and appears to be actively dying.  Her daughters are clear that goal is comfort and are concerned because she is in acute distress.  Length of Stay: 4  Current Medications: Scheduled Meds:  . sodium chloride flush  3 mL Intravenous Once    Continuous Infusions:   PRN Meds: acetaminophen **OR** acetaminophen, albuterol, glycopyrrolate, haloperidol lactate, HYDROmorphone (DILAUDID) injection, LORazepam, RESOURCE THICKENUP CLEAR  Physical Exam         General: Alert, awake, in moderate distress.  HEENT: No bruits, no goiter, no JVD Heart: Tachycardic. No murmur appreciated. Lungs: Diminshed air movement, scattered coarse Ext: No significant edema Skin: Warm and dry  Vital Signs: BP (!) 145/68 (BP Location: Left Arm)   Pulse 98   Temp 97.9 F (36.6 C)   Resp 18   Ht 5' (1.524 m)   Wt 55.2 kg   SpO2 (!) 85%   BMI 23.77 kg/m  SpO2: SpO2: (!) 85 % O2 Device: O2 Device: High Flow Nasal Cannula(salter) O2 Flow Rate: O2 Flow Rate (L/min): 15 L/min(will place on NRB post neb- rn aware)  Intake/output summary:   Intake/Output Summary (Last 24 hours) at  02/23/2018 2302 Last data filed at 02/23/2018 1800 Gross per 24 hour  Intake 740.98 ml  Output 800 ml  Net -59.02 ml   LBM: Last BM Date: 02/22/18 Baseline Weight: Weight: 55.2 kg Most recent weight: Weight: 55.2 kg       Palliative Assessment/Data:    Flowsheet Rows     Most Recent Value  Intake Tab  Referral Department  Hospitalist  Unit at Time of Referral  Med/Surg Unit  Palliative Care Primary Diagnosis  Sepsis/Infectious Disease  Date Notified  02/22/18  Palliative Care Type  New Palliative care  Reason for referral  Clarify Goals of Care  Date of Admission  02/09/2018  Date first seen by Palliative Care  02/22/18  # of days Palliative referral response time  0 Day(s)  # of days IP prior to Palliative referral  3  Clinical Assessment  Psychosocial & Spiritual Assessment  Palliative Care Outcomes      Patient Active Problem List   Diagnosis Date Noted  . Palliative care encounter   . Malnutrition of moderate degree 02/21/2018  . Pressure injury of skin 02/21/2018  . Sepsis due to pneumonia ([place]) 02/10/2018  . Dementia with behavioral disturbance ([place]) 02/12/2018  . Altered mental status 02/06/2018  . Hallucinations 12/09/2017  . Weight loss, unintentional 09/09/2017  . Gait abnormality 04/04/2017  . Mixed hyperlipidemia 03/21/2017  . Polycystic kidney disease 03/20/2017  . Essential hypertension 03/20/2017  . Asymptomatic postmenopausal estrogen deficiency 03/20/2017  . Bilateral hearing loss 03/20/2017  . Depression, major, single episode, complete remission ([place]) 03/20/2017  . Memory deficit 03/20/2017  . H/O unilateral nephrectomy 03/20/2017  . S/P partial colectomy 03/20/2017  . Chronic cystitis 06/27/2013  . Congenital polycystic kidney 07/07/2012  . Acquired absence of kidney 07/07/2012    Palliative Care Assessment & Plan   Patient Profile: Palliative Care consult requested for this 78 y.o. female with multiple medical problems including  dementia, arthritis on chronic immunosuppressive therapy, hypertension, and hyperlipidemia, who was admitted on 03/01/2018 with hypoxic respiratory failure after failing outpatient treatment for pneumonia.  Chest x-ray revealed bilateral infiltrates.  Patient was felt to have some component of aspiration and has been evaluated by speech therapy.  Diet was modified but patient was felt to have re-aspirated and respiratory status has further declined.  Repeat chest x-ray on 02/22/2018 reveals worsening multilobar bilateral pneumonia.  Palliative care was consulted to help address goals.  Recommendations/Plan:  Her condition has acutely worsened.  She is actively dying.  Goal is full comfort.  At this point, I do not think that she is going to become stable enough to transition home.  Family accepting of this.  For symptoms:  Acute SOB: Worsened this AM.  Family feel she became more nauseous and anxious after morphine.  Rotate to dilaudid 0.71m Q 1 hour prn.  Anxiety: Ativan as needed  Agitation: Haldol as needed  Excess secretions: Robinul as needed  Goals of Care and Additional Recommendations:  Limitations on Scope of Treatment: Full Comfort Care  Code Status:    Code Status Orders  (From admission, onward)         Start     Ordered   02/13/2018 1801  Do not attempt resuscitation (DNR)  Continuous    Question Answer Comment  In the event of cardiac or respiratory ARREST Do not call a "code blue"   In the event of cardiac or respiratory ARREST Do not perform Intubation, CPR, defibrillation or ACLS   In the event of cardiac or respiratory ARREST Use medication by any route, position, wound care, and other measures to relive pain and suffering. May use oxygen, suction and manual treatment of airway obstruction as needed for comfort.      02/24/2018 1805        Code Status History    This patient has a current code status but no historical code status.    Advance Directive  Documentation     Most Recent Value  Type of Advance Directive  Healthcare Power of [place] Living will  Pre-existing out of facility DNR order (yellow form or pink MOST form)  -  "MOST" Form in Place?  -       Prognosis:   Hours - Days- Her condition has acutely worsened.  She does not appear stable enough to me at this point to transition from the hospital.  Discussed prognosis with family.  Discharge Planning:  Anticipated Hospital Death  Care plan was discussed with family, RN, Dr. Wyline Copas  Thank you for allowing the Palliative Medicine Team to assist in the care of this patient.   Total Time 50 Prolonged Time Billed No      Greater than 50%  of this time was spent counseling and coordinating care related to the above assessment and plan.  Micheline Rough, MD  Please contact Palliative Medicine Team phone at [phone] for questions and concerns.

## 2018-02-23 NOTE — Care Management Important Message (Signed)
Important Message  Patient Details  Name: Tricia Daniel MRN: [id number] Date of Birth: 04-28-1940   Medicare Important Message Given:  Yes    Caren Macadam 02/23/2018, 12:19 PMImportant Message  Patient Details  Name: Tricia Daniel MRN: [id number] Date of Birth: 1940/08/08   Medicare Important Message Given:  Yes    Caren Macadam 02/23/2018, 12:19 PM

## 2018-02-24 LAB — CULTURE, BLOOD (ROUTINE X 2)
Culture: NO GROWTH
Culture: NO GROWTH
Special Requests: ADEQUATE

## 2018-02-24 NOTE — Progress Notes (Addendum)
PROGRESS NOTE    Tricia Daniel  SPQ:[id number] DOB: November 22, 1940 DOA: 02-26-2018 PCP: Anne Ng, NP    Brief Narrative:  78 y.o. female with medical history significant of dementia with sundowning, arthritis on chronic immunosuppressive tx, HLD, HTN, presents to ED with worsening sob and productive cough. Several weeks prior to admit, patient noted to have concerns of swallowing secretions and questionable aspiration. Pt is supposed to follow up with SLP eval this week. Patient was seen 3 days prior to visit where she was diagnosed with PNA on CXR and started on Levaquin. Symptoms worsened with increased sob, thus patient presented to ED for further evaluation  ED Course: In the ED, patient noted to have O2 sats in the 80's on room air, improved with 2LNC. CXR was performed with findings worrisome for worsening PNA despite recent abx tx. Patient was noted to have WBC of 12.2, K of 2.8, lactate of 2.0. Patient was given IVF bolus and started on empiric azithromycin and unasyn. Hospitalist consulted for consideration for admission.  Assessment & Plan:   Principal Problem:   Sepsis due to pneumonia [facility]) Active Problems:   Essential hypertension   Mixed hyperlipidemia   Dementia with behavioral disturbance (HCC)   Malnutrition of moderate degree   Pressure injury of skin   Palliative care encounter  1. Sepsis secondary to recurrent aspiration PNA 1. Presented septic with recurrent aspiration PNA 2. Worsening PNA while inpatient with worsening O2 requirements 3. Palliative Care now following with patient transitioned to comfort care status 4. Continue dilaudid PRN and ativan PRN 5. Actively dying. Considered residential hospice, however possible pt too ustable at this time for transfer 2. HTN 1. Now on comfort only measures per above 3. HLD 1. Had been stable. Now on comfort measures 4. Dementia 1. Cont with comfort measures 5. Acute hypoxemic respiratory failure 1. O2  requirements have worsened to NRB 2. Cont O2 for comfort 6. Unstageable buttock pressure injury  DVT prophylaxis: Lovenox subQ Code Status: DNR Family Communication: Pt in room, family at bedside Disposition Plan: Uncertain at this time  Consultants:   Palliative Care  Procedures:     Antimicrobials: Anti-infectives (From admission, onward)   Start     Dose/Rate Route Frequency Ordered Stop   02/22/18 1200  azithromycin (ZITHROMAX) 250 mg in dextrose 5 % 125 mL IVPB  Status:  Discontinued     250 mg 125 mL/hr over 60 Minutes Intravenous Every 24 hours 02/22/18 1023 02/23/18 1203   02/21/18 1800  azithromycin (ZITHROMAX) tablet 500 mg  Status:  Discontinued     500 mg Oral Daily-1800 02/21/18 1040 02/22/18 1023   02/20/18 1800  azithromycin (ZITHROMAX) 500 mg in sodium chloride 0.9 % 250 mL IVPB  Status:  Discontinued     500 mg 250 mL/hr over 60 Minutes Intravenous Every 24 hours Feb 26, 2018 1805 02/21/18 1040   02/20/18 1500  Ampicillin-Sulbactam (UNASYN) 3 g in sodium chloride 0.9 % 100 mL IVPB  Status:  Discontinued     3 g 200 mL/hr over 30 Minutes Intravenous Every 6 hours 02/20/18 1438 02/23/18 1203   02/26/18 2359  Ampicillin-Sulbactam (UNASYN) 3 g in sodium chloride 0.9 % 100 mL IVPB  Status:  Discontinued     3 g 200 mL/hr over 30 Minutes Intravenous Every 8 hours 02-26-2018 1837 02/20/18 1438   February 26, 2018 1600  Ampicillin-Sulbactam (UNASYN) 3 g in sodium chloride 0.9 % 100 mL IVPB     3 g 200 mL/hr over 30 Minutes Intravenous  Once 02/18/2018 1555 02/17/2018 1717   02/24/2018 1600  azithromycin (ZITHROMAX) 500 mg in sodium chloride 0.9 % 250 mL IVPB     500 mg 250 mL/hr over 60 Minutes Intravenous  Once 02/22/2018 1555 02/20/18 0659      Subjective: Complained of increased sob this AM  Objective: Vitals:   02/23/18 0617 02/23/18 1118 02/23/18 2142 02/24/18 1511  BP: (!) 145/68  111/72 125/60  Pulse: 98  97 98  Resp: 18  (!) 28 20  Temp: 97.9 F (36.6 C)  99 F (37.2  C) 98.3 F (36.8 C)  TempSrc:   Axillary Axillary  SpO2: 91% (!) 85% (!) 81% 90%  Weight:      Height:        Intake/Output Summary (Last 24 hours) at 02/24/2018 1742 Last data filed at 02/24/2018 [id number] Gross per 24 hour  Intake 0 ml  Output 0 ml  Net 0 ml   Filed Weights   02/20/18 [id number]  Weight: 55.2 kg    Examination: General exam: Conversant, increased resp effort Respiratory system: normal chest rise, clear, no audible wheezing Cardiovascular system: regular rhythm, s1-s2 Gastrointestinal system: Nondistended, nontender, pos BS Central nervous system: No seizures, no tremors Extremities: No cyanosis, no joint deformities Skin: No rashes, no pallor Psychiatry: Affect normal // no auditory hallucinations    Data Reviewed: I have personally reviewed following labs and imaging studies  CBC: Recent Labs  Lab 02/28/2018 1412 02/20/18 0553 02/21/18 0356 02/22/18 0442  WBC 12.2* 12.9* 11.6* 17.3*  NEUTROABS 9.2*  --   --   --   HGB 12.1 10.0* 10.2* 11.0*  HCT 40.0 33.9* 34.9* 37.6  MCV 96.4 98.8 98.0 95.4  PLT 359 280 338 399   Basic Metabolic Panel: Recent Labs  Lab 02/16/2018 1412 02/20/18 0553 02/21/18 0356 02/22/18 0442  NA 139 141 144 147*  K 2.8* 3.7 3.4* 3.6  CL 110 112* 114* 113*  CO2 20* 20* 23 23  GLUCOSE 170* 112* 111* 101*  BUN 23 17 15 15   CREATININE 0.82 0.61 0.65 0.65  CALCIUM 8.2* 7.8* 7.9* 7.9*  MG 1.9  --   --   --    GFR: Estimated Creatinine Clearance: 45.2 mL/min (by C-G formula based on SCr of 0.65 mg/dL). Liver Function Tests: Recent Labs  Lab 03/01/2018 1412 02/20/18 0553 02/22/18 0442  AST 33 26 35  ALT 15 13 12   ALKPHOS 72 52 78  BILITOT 0.5 0.6 0.8  PROT 7.1 6.0* 6.4*  ALBUMIN 2.9* 2.2* 2.2*   No results for input(s): LIPASE, AMYLASE in the last 168 hours. No results for input(s): AMMONIA in the last 168 hours. Coagulation Profile: Recent Labs  Lab 02/16/2018 1412  INR 1.10   Cardiac Enzymes: No results for input(s):  CKTOTAL, CKMB, CKMBINDEX, TROPONINI in the last 168 hours. BNP (last 3 results) No results for input(s): PROBNP in the last 8760 hours. HbA1C: No results for input(s): HGBA1C in the last 72 hours. CBG: No results for input(s): GLUCAP in the last 168 hours. Lipid Profile: No results for input(s): CHOL, HDL, LDLCALC, TRIG, CHOLHDL, LDLDIRECT in the last 72 hours. Thyroid Function Tests: No results for input(s): TSH, T4TOTAL, FREET4, T3FREE, THYROIDAB in the last 72 hours. Anemia Panel: No results for input(s): VITAMINB12, FOLATE, FERRITIN, TIBC, IRON, RETICCTPCT in the last 72 hours. Sepsis Labs: Recent Labs  Lab 02/18/2018 1413 02/11/2018 1852  LATICACIDVEN 2.0* 1.4    Recent Results (from the past 240 hour(s))  Culture, blood (  Routine x 2)     Status: None   Collection Time: 02/10/2018  2:13 PM  Result Value Ref Range Status   Specimen Description   Final    BLOOD RIGHT HAND Performed at [facility], [address], [place], [place] [address]    Special Requests   Final    BOTTLES DRAWN AEROBIC AND ANAEROBIC Blood Culture results may not be optimal due to an excessive volume of blood received in culture bottles Performed at [facility], [address], [place], [place] [address]    Culture   Final    NO GROWTH 5 DAYS Performed at [facility], [address], [place], [place] [address]    Report Status 02/24/2018 FINAL  Final  Culture, blood (Routine x 2)     Status: None   Collection Time: 02/14/2018  3:59 PM  Result Value Ref Range Status   Specimen Description   Final    BLOOD LEFT ANTECUBITAL Performed at [facility], [address], [place], [place] [address]    Special Requests   Final    BOTTLES DRAWN AEROBIC AND ANAEROBIC Blood Culture adequate volume Performed at [facility], [address], [place], [place] [address]    Culture   Final    NO GROWTH 5 DAYS Performed at [facility], [address], [place], [place] [address]    Report Status 02/24/2018 FINAL  Final     Radiology Studies: No results found.  Scheduled Meds: . sodium chloride flush  3 mL Intravenous Once   Continuous Infusions:    LOS: 5 days   Rickey Barbara, MD [facility] Pager On Amion  If 7PM-7AM, please contact night-coverage 02/24/2018, 5:42 PM

## 2018-02-24 NOTE — Progress Notes (Signed)
Daily Progress Note   Patient Name: Tricia DecampMiriam Daniel       Date: 02/24/2018 DOB: 06/28/1940  Age: 78 y.o. MRN#: [id number] Attending Physician: Jerald Kiefhiu, Stephen K, MD Primary Care Physician: Anne NgNche, Charlotte Lum, NP Admit Date: May 06, 2018  Reason for Consultation/Follow-up: Establishing goals of care, Non pain symptom management and Terminal Care  Subjective: Tricia Daniel essentially appears unresponsive, how ever, she appears comfortable  Son in law is in the room.     Length of Stay: 5  Current Medications: Scheduled Meds:  . sodium chloride flush  3 mL Intravenous Once    Continuous Infusions:   PRN Meds: acetaminophen **OR** acetaminophen, albuterol, glycopyrrolate, haloperidol lactate, HYDROmorphone (DILAUDID) injection, LORazepam, RESOURCE THICKENUP CLEAR  Physical Exam         General: unresponsive no distress.  HEENT: No bruits, no goiter, no JVD Heart: Tachycardic. No murmur appreciated. Lungs: Diminshed air movement, scattered coarserhonchi Ext: No significant edema Skin: Warm and dry  Vital Signs: BP 111/72 (BP Location: Left Arm)   Pulse 97   Temp 99 F (37.2 C) (Axillary)   Resp (!) 28   Ht 5' (1.524 m)   Wt 55.2 kg   SpO2 (!) 81%   BMI 23.77 kg/m  SpO2: SpO2: (!) 81 % O2 Device: O2 Device: High Flow Nasal Cannula O2 Flow Rate: O2 Flow Rate (L/min): 15 L/min  Intake/output summary:   Intake/Output Summary (Last 24 hours) at 02/24/2018 1453 Last data filed at 02/24/2018 [id number] Gross per 24 hour  Intake 0 ml  Output 0 ml  Net 0 ml   LBM: Last BM Date: 02/22/18 Baseline Weight: Weight: 55.2 kg Most recent weight: Weight: 55.2 kg       Palliative Assessment/Data:    Flowsheet Rows     Most Recent Value  Intake Tab  Referral Department  Hospitalist    Unit at Time of Referral  Med/Surg Unit  Palliative Care Primary Diagnosis  Sepsis/Infectious Disease  Date Notified  02/22/18  Palliative Care Type  New Palliative care  Reason for referral  Clarify Goals of Care  Date of Admission  09-17-2018  Date first seen by Palliative Care  02/22/18  # of days Palliative referral response time  0 Day(s)  # of days IP prior to Palliative referral  3  Clinical Assessment  Psychosocial &  Spiritual Assessment  Palliative Care Outcomes      Patient Active Problem List   Diagnosis Date Noted  . Palliative care encounter   . Malnutrition of moderate degree 02/21/2018  . Pressure injury of skin 02/21/2018  . Sepsis due to pneumonia (HCC) 03/03/2018  . Dementia with behavioral disturbance (HCC) 02/23/2018  . Altered mental status 02/06/2018  . Hallucinations 12/09/2017  . Weight loss, unintentional 09/09/2017  . Gait abnormality 04/04/2017  . Mixed hyperlipidemia 03/21/2017  . Polycystic kidney disease 03/20/2017  . Essential hypertension 03/20/2017  . Asymptomatic postmenopausal estrogen deficiency 03/20/2017  . Bilateral hearing loss 03/20/2017  . Depression, major, single episode, complete remission (HCC) 03/20/2017  . Memory deficit 03/20/2017  . H/O unilateral nephrectomy 03/20/2017  . S/P partial colectomy 03/20/2017  . Chronic cystitis 06/27/2013  . Congenital polycystic kidney 07/07/2012  . Acquired absence of kidney 07/07/2012    Palliative Care Assessment & Plan   Patient Profile: Palliative Care consult requested for this 78 y.o. female with multiple medical problems including dementia, arthritis on chronic immunosuppressive therapy, hypertension, and hyperlipidemia, who was admitted on 02/22/2018 with hypoxic respiratory failure after failing outpatient treatment for pneumonia.  Chest x-ray revealed bilateral infiltrates.  Patient was felt to have some component of aspiration and has been evaluated by speech therapy.  Diet was  modified but patient was felt to have re-aspirated and respiratory status has further declined.  Repeat chest x-ray on 02/22/2018 reveals worsening multilobar bilateral pneumonia.  Palliative care was consulted to help address goals.  Recommendations/Plan:    She is actively dying.  Goal is full comfort.  At this point, I do not think that she is going to become stable enough to transition home.  Family accepting of this.  For symptoms:  Acute SOB:  dilaudid 0.5mg  Q 1 hour prn.  Anxiety: Ativan as needed  Agitation: Haldol as needed  Excess secretions: Robinul as needed  Goals of Care and Additional Recommendations:  Limitations on Scope of Treatment: Full Comfort Care  Code Status:    Code Status Orders  (From admission, onward)         Start     Ordered   02/24/2018 1801  Do not attempt resuscitation (DNR)  Continuous    Question Answer Comment  In the event of cardiac or respiratory ARREST Do not call a "code blue"   In the event of cardiac or respiratory ARREST Do not perform Intubation, CPR, defibrillation or ACLS   In the event of cardiac or respiratory ARREST Use medication by any route, position, wound care, and other measures to relive pain and suffering. May use oxygen, suction and manual treatment of airway obstruction as needed for comfort.      02/24/2018 1805        Code Status History    This patient has a current code status but no historical code status.    Advance Directive Documentation     Most Recent Value  Type of Advance Directive  Healthcare Power of Attorney, Living will  Pre-existing out of facility DNR order (yellow form or pink MOST form)  -  "MOST" Form in Place?  -       Prognosis:   Hours - Days- Her condition has acutely worsened.  She does not appear stable enough to me at this point to transition from the hospital.  Discussed prognosis with family.  Discharge Planning:  Anticipated Hospital Death  Care plan was discussed with  family, RN, Dr. Rhona Leavens  Thank you for allowing the Palliative Medicine Team to assist in the care of this patient.   Total Time 25 Prolonged Time Billed No      Greater than 50%  of this time was spent counseling and coordinating care related to the above assessment and plan.  Rosalin HawkingZeba Phillp Dolores, MD [phone]  Please contact Palliative Medicine Team phone at [phone] for questions and concerns.

## 2018-02-24 NOTE — Progress Notes (Signed)
Spoke with family in response to CSW consult- family requesting for pt to transition to hospice [facility] for residential specifically). CSW made referral noting pt charting indicates may not be stable for transfer from hospital due to acutely worsening condition.   Ilean Skill, MSW, LCSW Clinical Social Work 02/24/2018 [phone]

## 2018-02-25 MED ORDER — LORAZEPAM 2 MG/ML IJ SOLN: 1.0000 mg | INTRAMUSCULAR | Status: DC | PRN

## 2018-02-25 MED ORDER — HYDROMORPHONE HCL 1 MG/ML IJ SOLN: 0.7500 mg | INTRAMUSCULAR | Status: DC | PRN

## 2018-02-27 ENCOUNTER — Ambulatory Visit: Payer: Medicare Other | Admitting: Physical Therapy

## 2018-03-02 ENCOUNTER — Ambulatory Visit: Payer: Medicare Other | Admitting: Physical Therapy

## 2018-03-06 ENCOUNTER — Ambulatory Visit: Payer: Medicare Other | Admitting: Physical Therapy

## 2018-03-08 NOTE — Death Summary Note (Signed)
DEATH SUMMARY   Patient Details  Name: Tricia Daniel MRN: [id number] DOB: [birth year]  Admission/Discharge Information   Admit Date:  02-23-2018  Date of Death:    Time of Death:    Length of Stay: 6  Referring Physician: Anne Ng, NP   Reason(s) for Hospitalization  Acute hypoxic respiratory failure Aspiration pneumonia  Diagnoses  Preliminary cause of death: Aspiration pneumonia (HCC) Secondary Diagnoses (including complications and co-morbidities):  Principal Problem:   Sepsis due to pneumonia [facility]) Active Problems:   Essential hypertension   Mixed hyperlipidemia   Dementia with behavioral disturbance (HCC)   Malnutrition of moderate degree   Pressure injury of skin   Palliative care encounter   Brief Hospital Course (including significant findings, care, treatment, and services provided and events leading to death)  Tricia Daniel is a 78 y.o. year old female who with medical history significant ofdementia with sundowning, arthritis on chronic immunosuppressive tx, HLD, HTN, presents to ED with worsening sob and productive cough. Several weeks prior to admit, patient noted to have concerns of swallowing secretions and questionable aspiration. Pt is supposed to follow up with SLP eval this week. Patient was seen 3 days prior to visit where she was diagnosed with PNA on CXR and started on Levaquin. Symptoms worsened with increased sob, thus patient presented to ED for further evaluation  In the ED, patient noted to have O2 sats in the 80's on room air, improved with 2LNC. CXR was performed with findings worrisome for worsening PNA despite recent abx tx. Patient was noted to have WBC of 12.2, K of 2.8, lactate of 2.0. Patient was given IVF bolus and started on empiric azithromycin and unasyn.   Patient was admitted for aggressive treatment with IV antibiotics.  She was seen by speech pathology, physical therapy.  Evaluation by speech therapy noted severe oral  pharyngeal dysphasia with likely esophageal component.  Aggressive treatment, she continued to require higher levels of supplemental oxygen to maintain adequate SPO2.  Given her continued decline, palliative care was consulted for assistance with goals of care and medical decision making.  After long discussion with family, they felt that she was approaching her end-of-life given her significant progressive decline.  After further discussion with the palliative care team, family decided to transition care to more comfort measures.  Patient was being evaluated by [facility], but given her frailty family elected for her to remain inpatient for the duration of her life.  Notified by nursing 03/01/2018 that patient had passed.  Patient was surrounded by family members and condolences offered.  Time of death 7.     Pertinent Labs and Studies  Significant Diagnostic Studies Dg Chest 2 View  Result Date: 02-23-2018 CLINICAL DATA:  Pt diagnosed with pna on Monday. Antibiotics at home. Not improving. Coughing. Shortness of breath. Hx HTN, CKD. EXAM: CHEST - 2 VIEW COMPARISON:  02/16/2018 FINDINGS: Heart size is accentuated by AP technique. Increased airspace filling opacities are identified in the RIGHT UPPER lobe and LEFT perihilar region compared to prior study. Chronic perihilar peribronchial thickening. No pulmonary edema. Convex RIGHT scoliosis of the thoracic spine. IMPRESSION: Increased bilateral infiltrates. Electronically Signed   By: Norva Pavlov M.D.   On: 02/23/18 14:29   Dg Chest 2 View  Result Date: 02/16/2018 CLINICAL DATA:  78 year old female with cough and low-grade fever EXAM: CHEST - 2 VIEW COMPARISON:  Prior chest x-ray 02/06/2018 FINDINGS: Cardiac and mediastinal contours are within normal limits. Mild atherosclerotic calcifications present in  the transverse aorta. Small volume patchy airspace opacity in the right mid lung is a new finding and concerning for right  lower lobe bronchopneumonia. Stable chronic bronchitic changes and peripheral reticular interstitial prominence. No pleural effusion or pneumothorax. Advanced bilateral glenohumeral joint osteoarthritis. No acute fracture or malalignment. IMPRESSION: New patchy right lower lobe airspace opacity concerning for bronchopneumonia. Electronically Signed   By: Malachy Moan M.D.   On: 02/16/2018 14:59   Ct Head Wo Contrast  Result Date: 02/06/2018 CLINICAL DATA:  Onset slurred speech and left eye drooping last night at 8 p.m. EXAM: CT HEAD WITHOUT CONTRAST TECHNIQUE: Contiguous axial images were obtained from the base of the skull through the vertex without intravenous contrast. COMPARISON:  None. FINDINGS: Brain: No evidence of acute infarction, hemorrhage, hydrocephalus, extra-axial collection or mass lesion/mass effect. Atrophy and chronic microvascular ischemic change noted. Vascular: No hyperdense vessel or unexpected calcification. Skull: Intact.  No focal lesion. Sinuses/Orbits: Negative. Other: None. IMPRESSION: No acute abnormality. Atrophy and chronic microvascular ischemic change. Electronically Signed   By: Drusilla Kanner M.D.   On: 02/06/2018 13:31   Mr Brain Wo Contrast  Result Date: 02/06/2018 CLINICAL DATA:  Slurred speech, LEFT facial droop beginning last night. History of dementia, hypertension and hyperlipidemia. EXAM: MRI HEAD WITHOUT CONTRAST TECHNIQUE: Multiplanar, multiecho pulse sequences of the brain and surrounding structures were obtained without intravenous contrast. COMPARISON:  CT HEAD February 06, 2018 and MRI head April 18, 2017. FINDINGS: Moderately motion degraded sequences after multiple attempts. INTRACRANIAL CONTENTS: No reduced diffusion to suggest acute ischemia. No susceptibility artifact to suggest hemorrhage. RIGHT cerebellar suspected developmental venous anomaly. Patchy supratentorial white matter T2 hyperintensities compatible. No advanced parenchymal brain volume  loss for age. No hydrocephalus. No suspicious parenchymal signal, masses, mass effect. No abnormal extra-axial fluid collections. No extra-axial masses. VASCULAR: Normal major intracranial vascular flow voids present at skull base. SKULL AND UPPER CERVICAL SPINE: No abnormal sellar expansion. No suspicious calvarial bone marrow signal. Craniocervical junction maintained. SINUSES/ORBITS: The mastoid air-cells and included paranasal sinuses are well-aerated.The included ocular globes and orbital contents are non-suspicious. OTHER: Patient is edentulous. IMPRESSION: 1. Moderately motion degraded examination. No acute intracranial process. 2. Moderate chronic small vessel ischemic changes. Electronically Signed   By: Awilda Metro M.D.   On: 02/06/2018 15:05   Dg Chest Port 1 View  Result Date: 02/22/2018 CLINICAL DATA:  Follow-up aspiration pneumonia. Shortness of breath. EXAM: PORTABLE CHEST 1 VIEW COMPARISON:  2018/03/10 and earlier. FINDINGS: Interval worsening of the airspace opacities in the RIGHT LOWER LOBE, LEFT LOWER LOBE and RIGHT UPPER LOBE. No new disease. Possible small BILATERAL effusions. Cardiac silhouette upper normal in size to slightly enlarged for AP portable technique. Pulmonary vascularity normal. Severe degenerative changes in both shoulders with modeling of the humeral head and glenoid bilaterally. IMPRESSION: Worsening multilobar BILATERAL pneumonia. Electronically Signed   By: Hulan Saas M.D.   On: 02/22/2018 09:03   Dg Chest Portable 1 View  Result Date: 02/06/2018 CLINICAL DATA:  Episode of difficulty swallowing yesterday. Question aspiration. EXAM: PORTABLE CHEST 1 VIEW COMPARISON:  PA and lateral chest 09/09/2017. FINDINGS: Mild left basilar atelectasis or scar is unchanged. Lungs otherwise clear. Heart size normal. No pneumothorax or pleural effusion. No acute bony abnormality. Severe bilateral glenohumeral osteoarthritis noted. IMPRESSION: Negative for aspiration.  No  acute disease. Electronically Signed   By: Drusilla Kanner M.D.   On: 02/06/2018 13:10   Dg Swallowing Func-speech Pathology  Result Date: 02/23/2018 Objective Swallowing Evaluation: Type of Study:  MBS-Modified Barium Swallow Study  Patient Details Name: Halford DecampMiriam Venditto MRN: [id number] Date of Birth: 09/12/1940 Today's Date: 02/20/2018 Time: SLP Start Time (ACUTE ONLY): 1515 -SLP Stop Time (ACUTE ONLY): 1548 SLP Time Calculation (min) (ACUTE ONLY): 33 min Past Medical History: Past Medical History: Diagnosis Date . Allergy  . Arthritis  . Chronic kidney disease  . Dementia (HCC)  . Depression  . Gait abnormality 04/04/2017 . Hyperlipidemia  . Hypertension  Past Surgical History: Past Surgical History: Procedure Laterality Date . COLOSTOMY  1988 . COLOSTOMY REVERSAL  1999 . nephrectomy Left 1988 . TONSILLECTOMY   HPI: pt is a 78 yo female with h/o TIA/CVAs who was recently in the hospital with UTI, discharged and then found to have pna.  Pt admitted with sepsis and pna.  CXR showed progressing pna. Swallow eval ordered. Son in law states pt was to have a swallow eval at OP.  Subjective: pt awake in bed, spouse is present Assessment / Plan / Recommendation CHL IP CLINICAL IMPRESSIONS 02/21/2018 Clinical Impression Pt presents with moderate oral and mild pharyngeal dysphagia with sensorimotor components. Also suspect component of esophageal deficits given upon sweep, pt appeared with residuals *lighter shade ? Secretions* in esophagus without awareness.  Decreased oral control, oral holding results in delayed transiting, premature spillage and oral residuals.  Pt with decreased sensation to oral residuals and needs cues to swallow when she is able to elicit- SLP used visual cues of "chewing" to allow pt to mimic mastication.  She required puree to orally transit and swallow cracker bolus after she did not elicit swallow for several minutes of time.    Aspiration of thin *minimal* x at least once of thin = which pt sensed  and presented with reflexive cough.(SLP to review test further)  Pt allow spillage of bolus into larynx prior to triggering a reflexive swallow.  Reflexive cough was weak and not effective to clear.  Chin tuck posture appeared to help protect airway as it resulted in only trace laryngeal penetration of thin.  Pt is also mildly penetrating nectar boluses.   Given pt's current medical illness/pna - would recommend to modify diet to dys3/ground meats/nectar and provide thin water via tsp between meals after mouth care.  Please use puree to aid oral transiting of solids after pt masticates them - as she will hold and not elicit swallow. Will need to follow VERY strict aspiration precautions to mitigate aspiration risk.   Daughter Rosey Batheresa present and was educated to findings of MBS using video loops to aid understanding.  Will follow up for tolerance and ongoing education regarding risk factors for aspiration pneumonias.   SLP Visit Diagnosis Dysphagia, oropharyngeal phase (R13.12);Dysphagia, unspecified (R13.10);Other (comment) Attention and concentration deficit following -- Frontal lobe and executive function deficit following -- Impact on safety and function --   CHL IP TREATMENT RECOMMENDATION 02/20/2018 Treatment Recommendations F/U MBS in --- days (Comment);Defer until completion of intrumental exam   No flowsheet data found. CHL IP DIET RECOMMENDATION 02/21/2018 SLP Diet Recommendations -- Liquid Administration via -- Medication Administration -- Compensations Slow rate;Small sips/bites;Use straw to facilitate chin tuck Postural Changes --   No flowsheet data found.  CHL IP FOLLOW UP RECOMMENDATIONS 02/21/2018 Follow up Recommendations Other (comment)   No flowsheet data found.     CHL IP ORAL PHASE 02/20/2018 Oral Phase Impaired Oral - Pudding Teaspoon -- Oral - Pudding Cup -- Oral - Honey Teaspoon -- Oral - Honey Cup -- Oral - Nectar Teaspoon Premature spillage;Decreased bolus cohesion;Delayed  oral transit;Weak  lingual manipulation;Lingual/palatal residue;Reduced posterior propulsion Oral - Nectar Cup Premature spillage;Decreased bolus cohesion;Delayed oral transit;Weak lingual manipulation;Reduced posterior propulsion;Lingual/palatal residue Oral - Nectar Straw Premature spillage;Decreased bolus cohesion;Delayed oral transit;Weak lingual manipulation;Reduced posterior propulsion;Lingual/palatal residue Oral - Thin Teaspoon Premature spillage;Decreased bolus cohesion;Weak lingual manipulation;Reduced posterior propulsion;Lingual/palatal residue Oral - Thin Cup Premature spillage;Decreased bolus cohesion;Weak lingual manipulation;Reduced posterior propulsion;Lingual/palatal residue Oral - Thin Straw Premature spillage;Decreased bolus cohesion;Weak lingual manipulation;Reduced posterior propulsion;Delayed oral transit;Lingual/palatal residue Oral - Puree Premature spillage;Decreased bolus cohesion;Weak lingual manipulation;Reduced posterior propulsion;Delayed oral transit Oral - Mech Soft Premature spillage;Decreased bolus cohesion;Weak lingual manipulation;Reduced posterior propulsion;Impaired mastication;Delayed oral transit Oral - Regular -- Oral - Multi-Consistency -- Oral - Pill Premature spillage;Decreased bolus cohesion;Weak lingual manipulation;Reduced posterior propulsion;Delayed oral transit Oral Phase - Comment oral residuals present with pt not having sensation  CHL IP PHARYNGEAL PHASE 02/20/2018 Pharyngeal Phase Impaired Pharyngeal- Pudding Teaspoon -- Pharyngeal -- Pharyngeal- Pudding Cup -- Pharyngeal -- Pharyngeal- Honey Teaspoon -- Pharyngeal -- Pharyngeal- Honey Cup -- Pharyngeal -- Pharyngeal- Nectar Teaspoon -- Pharyngeal -- Pharyngeal- Nectar Cup Penetration/Aspiration during swallow Pharyngeal Material enters airway, remains ABOVE vocal cords and not ejected out Pharyngeal- Nectar Straw Penetration/Aspiration during swallow;Reduced airway/laryngeal closure;Reduced laryngeal elevation Pharyngeal Material  enters airway, remains ABOVE vocal cords and not ejected out Pharyngeal- Thin Teaspoon WFL;Reduced airway/laryngeal closure;Reduced laryngeal elevation Pharyngeal -- Pharyngeal- Thin Cup Penetration/Aspiration before swallow Pharyngeal Material enters airway, passes BELOW cords and not ejected out despite cough attempt by patient Pharyngeal- Thin Straw Penetration/Aspiration before swallow;Penetration/Apiration after swallow Pharyngeal Material enters airway, passes BELOW cords and not ejected out despite cough attempt by patient Pharyngeal- Puree WFL Pharyngeal -- Pharyngeal- Mechanical Soft WFL Pharyngeal -- Pharyngeal- Regular -- Pharyngeal -- Pharyngeal- Multi-consistency -- Pharyngeal -- Pharyngeal- Pill WFL Pharyngeal -- Pharyngeal Comment --  No flowsheet data found. Chales AbrahamsKimball, Tamara Ann 02/23/2018, 11:52 AM  Donavan Burnetamara Kimball, MS [facility] SLP Acute Rehab Services Pager [phone] Office [phone]              Microbiology Recent Results (from the past 240 hour(s))  Culture, blood (Routine x 2)     Status: None   Collection Time: 02/22/2018  2:13 PM  Result Value Ref Range Status   Specimen Description   Final    BLOOD RIGHT HAND Performed at [facility], [address] [place], [place] [address]    Special Requests   Final    BOTTLES DRAWN AEROBIC AND ANAEROBIC Blood Culture results may not be optimal due to an excessive volume of blood received in culture bottles Performed at [facility], [address] [place], [place] [address]    Culture   Final    NO GROWTH 5 DAYS Performed at [facility], [address] [place], [place] [address]    Report Status 02/24/2018 FINAL  Final  Culture, blood (Routine x 2)     Status: None   Collection Time: 02/12/2018  3:59 PM  Result Value Ref Range Status   Specimen Description   Final    BLOOD LEFT ANTECUBITAL Performed at [facility], [address] [place], [place] [address]     Special Requests   Final    BOTTLES DRAWN AEROBIC AND ANAEROBIC Blood Culture adequate volume Performed at [facility], [address] [place], [place] [address]    Culture   Final    NO GROWTH 5 DAYS Performed at [facility], [address] [place], [place] [address]  Report Status 02/24/2018 FINAL  Final    Lab Basic Metabolic Panel: Recent Labs  Lab 02/17/2018 1412 02/20/18 0553 02/21/18 0356 02/22/18 0442  NA 139 141 144 147*  K 2.8* 3.7 3.4* 3.6  CL 110 112* 114* 113*  CO2 20* 20* 23 23  GLUCOSE 170* 112* 111* 101*  BUN CREATININE 0.82 0.61 0.65 0.65  CALCIUM 8.2* 7.8* 7.9* 7.9*  MG 1.9  --   --   --    Liver Function Tests: Recent Labs  Lab 02/20/2018 1412 02/20/18 0553 02/22/18 0442  AST 33 26 35  ALT ALKPHOS 72 52 78  BILITOT 0.5 0.6 0.8  PROT 7.1 6.0* 6.4*  ALBUMIN 2.9* 2.2* 2.2*   No results for input(s): LIPASE, AMYLASE in the last 168 hours. No results for input(s): AMMONIA in the last 168 hours. CBC: Recent Labs  Lab 02/27/2018 1412 02/20/18 0553 02/21/18 0356 02/22/18 0442  WBC 12.2* 12.9* 11.6* 17.3*  NEUTROABS 9.2*  --   --   --   HGB 12.1 10.0* 10.2* 11.0*  HCT 40.0 33.9* 34.9* 37.6  MCV 96.4 98.8 98.0 95.4  PLT 359 280 338 399   Cardiac Enzymes: No results for input(s): CKTOTAL, CKMB, CKMBINDEX, TROPONINI in the last 168 hours. Sepsis Labs: Recent Labs  Lab 02/11/2018 1412 03/05/2018 1413 02/13/2018 1852 02/20/18 0553 02/21/18 0356 02/22/18 0442  WBC 12.2*  --   --  12.9* 11.6* 17.3*  LATICACIDVEN  --  2.0* 1.4  --   --   --     Procedures/Operations  none   Alvira Philips [place] 03/08/2018, 4:10 PM

## 2018-03-08 NOTE — Progress Notes (Signed)
[facility]  Received referral for [facility] interest from [employer].  Spoke with family to confirm interest and explain services.  Unable to offer a bed today.  Will re-assess in the am, and follow up with pt family and LCSW.  Please call for any hospice related questions.  Thank you, Wallis Bamberg BSN, RN  [employer]  [phone]

## 2018-03-08 NOTE — Progress Notes (Addendum)
Daily Progress Note   Patient Name: Tricia Daniel       Date: May 31, 2018 DOB: 11/09/1940  Age: 78 y.o. MRN#: [id number] Attending Physician: [place], Eric J, DO Primary Care Physician: Anne NgNche, Charlotte Lum, NP Admit Date: 02/17/2018  Reason for Consultation/Follow-up: Establishing goals of care, Non pain symptom management and Terminal Care  Subjective: Tricia Daniel is restless, she appears to have coarse grunting respirations. She has been trying to slide out of bed. Both daughters at bedside.    Chart reviewed. Med history reviewed. She has required 3.5 mg IV Dilaudid in the past 24 hours.     Length of Stay: 6  Current Medications: Scheduled Meds:  . sodium chloride flush  3 mL Intravenous Once    Continuous Infusions:   PRN Meds: acetaminophen **OR** acetaminophen, albuterol, glycopyrrolate, haloperidol lactate, HYDROmorphone (DILAUDID) injection, LORazepam, RESOURCE THICKENUP CLEAR  Physical Exam         General: unresponsive mild distress.  HEENT: No bruits, no goiter, no JVD Heart: Tachycardic. No murmur appreciated. Lungs: Diminshed air movement, scattered coarserhonchi Ext: No significant edema Skin: Warm and dry  Vital Signs: BP 125/60 (BP Location: Right Arm)   Pulse 98   Temp 98.3 F (36.8 C) (Axillary)   Resp 20   Ht 5' (1.524 m)   Wt 55.2 kg   SpO2 90%   BMI 23.77 kg/m  SpO2: SpO2: 90 % O2 Device: O2 Device: High Flow Nasal Cannula O2 Flow Rate: O2 Flow Rate (L/min): 15 L/min  Intake/output summary:   Intake/Output Summary (Last 24 hours) at May 31, 2018 1334 Last data filed at 02/24/2018 2200 Gross per 24 hour  Intake 0 ml  Output -  Net 0 ml   LBM: Last BM Date: 02/22/18 Baseline Weight: Weight: 55.2 kg Most recent weight: Weight: 55.2 kg         Palliative Assessment/Data:    Flowsheet Rows     Most Recent Value  Intake Tab  Referral Department  Hospitalist  Unit at Time of Referral  Med/Surg Unit  Palliative Care Primary Diagnosis  Sepsis/Infectious Disease  Date Notified  02/22/18  Palliative Care Type  New Palliative care  Reason for referral  Clarify Goals of Care  Date of Admission  02/17/2018  Date first seen by Palliative Care  02/22/18  # of days Palliative referral  response time  0 Day(s)  # of days IP prior to Palliative referral  3  Clinical Assessment  Psychosocial & Spiritual Assessment  Palliative Care Outcomes      Patient Active Problem List   Diagnosis Date Noted  . Palliative care encounter   . Malnutrition of moderate degree 02/21/2018  . Pressure injury of skin 02/21/2018  . Sepsis due to pneumonia (HCC) 10-23-2018  . Dementia with behavioral disturbance (HCC) 10-23-2018  . Altered mental status 02/06/2018  . Hallucinations 12/09/2017  . Weight loss, unintentional 09/09/2017  . Gait abnormality 04/04/2017  . Mixed hyperlipidemia 03/21/2017  . Polycystic kidney disease 03/20/2017  . Essential hypertension 03/20/2017  . Asymptomatic postmenopausal estrogen deficiency 03/20/2017  . Bilateral hearing loss 03/20/2017  . Depression, major, single episode, complete remission (HCC) 03/20/2017  . Memory deficit 03/20/2017  . H/O unilateral nephrectomy 03/20/2017  . S/P partial colectomy 03/20/2017  . Chronic cystitis 06/27/2013  . Congenital polycystic kidney 07/07/2012  . Acquired absence of kidney 07/07/2012    Palliative Care Assessment & Plan   Patient Profile: Palliative Care consult requested for this [age] y.o. female with multiple medical problems including dementia, arthritis on chronic immunosuppressive therapy, hypertension, and hyperlipidemia, who was admitted on 19-Mar-2018 with hypoxic respiratory failure after failing outpatient treatment for pneumonia.  Chest x-ray revealed bilateral  infiltrates.  Patient was felt to have some component of aspiration and has been evaluated by speech therapy.  Diet was modified but patient was felt to have re-aspirated and respiratory status has further declined.  Repeat chest x-ray on 02/22/2018 reveals worsening multilobar bilateral pneumonia.  Palliative care was consulted to help address goals.  Recommendations/Plan:    She is actively dying.  Goal is full comfort.  At this point, I do not think that she is going to become stable enough to transition home.  Family accepting of this.  For symptoms:  Acute SOB: increase dilaudid 0.75mg  Q 1 hour prn.  Anxiety: Ativan to be changed to Q3 hours prn.  Agitation: Haldol as needed  Excess secretions: Robinul as needed  Goals of Care and Additional Recommendations:  Limitations on Scope of Treatment: Full Comfort Care  Code Status:    Code Status Orders  (From admission, onward)         Start     Ordered   2018-03-01 1801  Do not attempt resuscitation (DNR)  Continuous    Question Answer Comment  In the event of cardiac or respiratory ARREST Do not call a "code blue"   In the event of cardiac or respiratory ARREST Do not perform Intubation, CPR, defibrillation or ACLS   In the event of cardiac or respiratory ARREST Use medication by any route, position, wound care, and other measures to relive pain and suffering. May use oxygen, suction and manual treatment of airway obstruction as needed for comfort.      2018-03-01 1805        Code Status History    This patient has a current code status but no historical code status.    Advance Directive Documentation     Most Recent Value  Type of Advance Directive  Healthcare Power of Attorney, Living will  Pre-existing out of facility DNR order (yellow form or pink MOST form)  -  "MOST" Form in Place?  -       Prognosis:   Hours - Days- Her condition has acutely worsened.  She does not appear stable enough to me at this point to  transition from the hospital.  Discussed prognosis with family.  Discharge Planning:  Anticipated Hospital Death  Care plan was discussed with family,    Thank you for allowing the Palliative Medicine Team to assist in the care of this patient.   Total Time 25 Prolonged Time Billed No      Greater than 50%  of this time was spent counseling and coordinating care related to the above assessment and plan.  Rosalin Hawking, MD [phone]  Please contact Palliative Medicine Team phone at [phone] for questions and concerns.

## 2018-03-08 DEATH — deceased

## 2018-03-09 ENCOUNTER — Ambulatory Visit: Payer: Medicare Other | Admitting: Physical Therapy

## 2018-03-11 ENCOUNTER — Ambulatory Visit: Payer: Medicare Other | Admitting: Nurse Practitioner

## 2018-03-13 ENCOUNTER — Ambulatory Visit: Payer: Medicare Other | Admitting: Physical Therapy

## 2018-07-07 ENCOUNTER — Ambulatory Visit: Payer: Medicare Other | Admitting: Neurology

## 2020-10-25 IMAGING — DX DG CHEST 1V PORT
1 series · 1 of 1 positions shown · non-contrast
Comparison: PA and lateral chest 09/09/2017.

CLINICAL DATA: Episode of difficulty swallowing yesterday. Question
aspiration.

EXAM:
PORTABLE CHEST 1 VIEW

[chest ap]
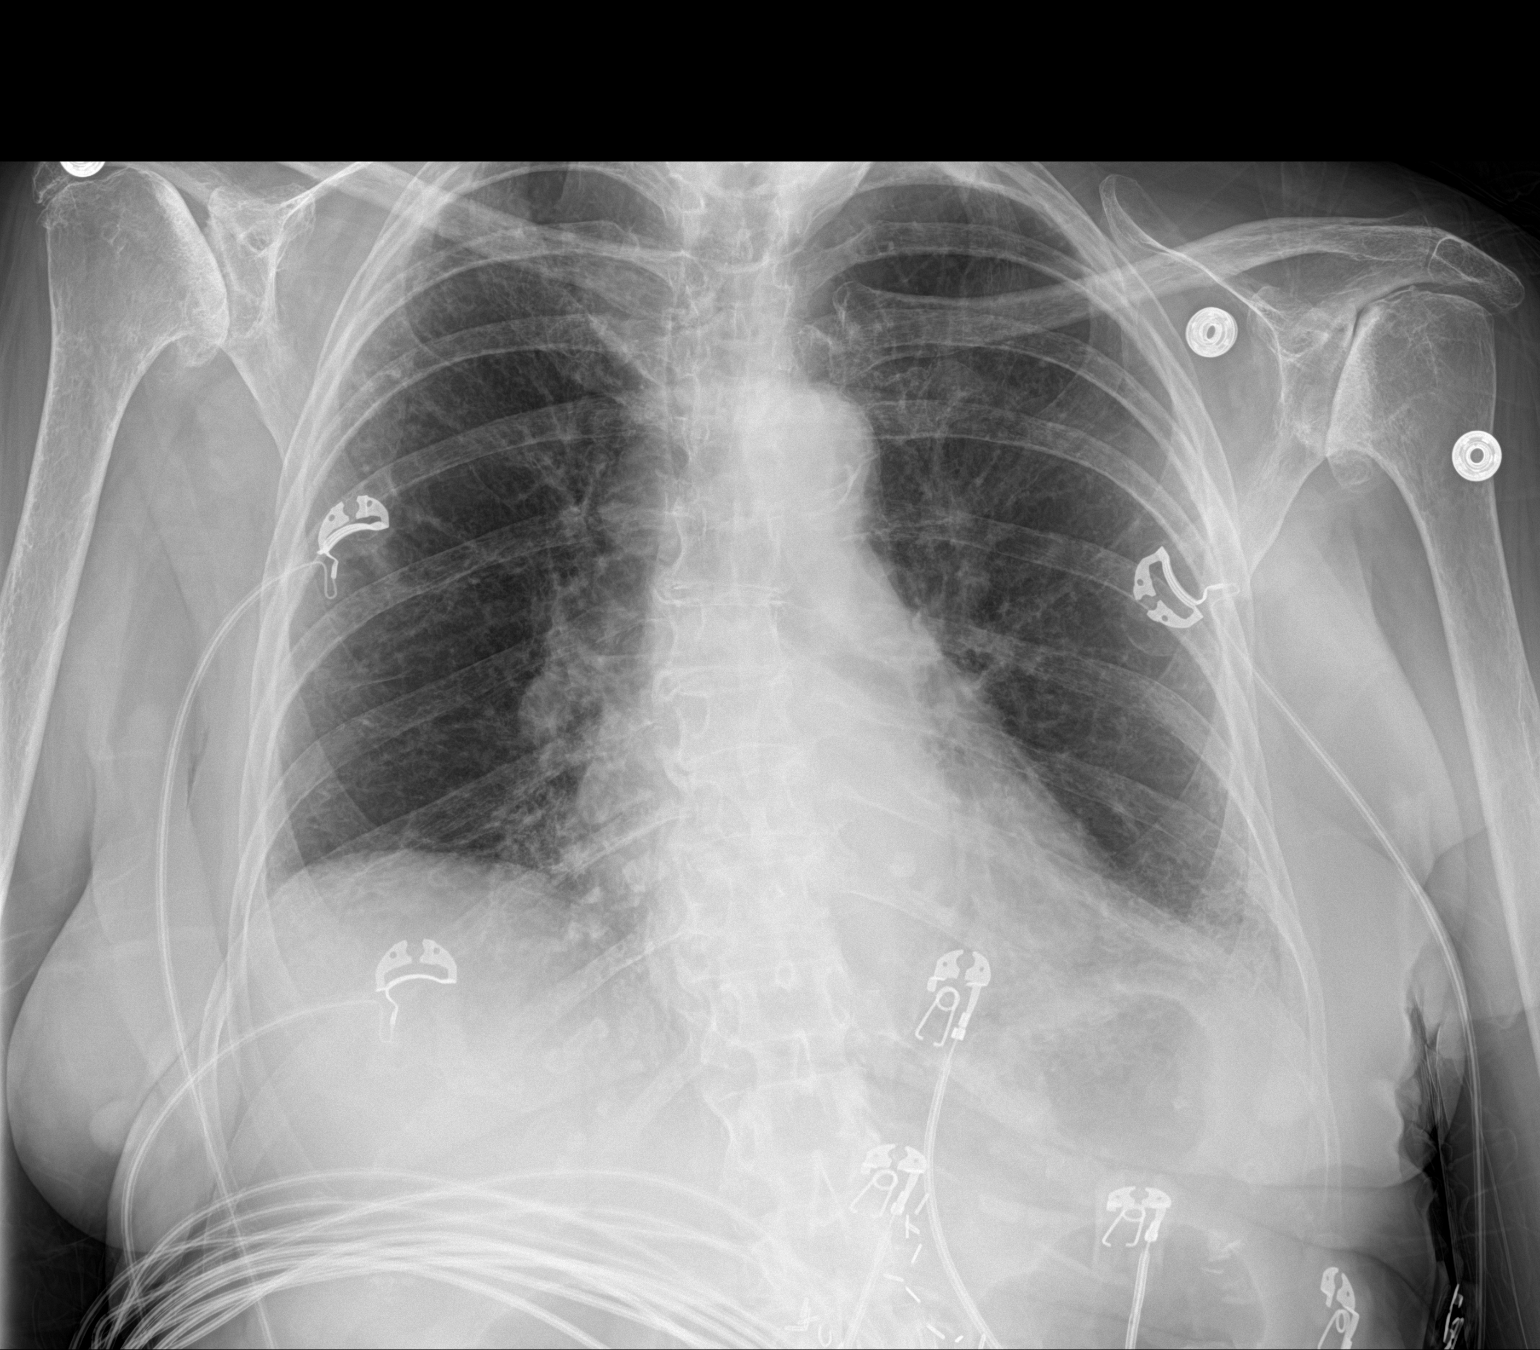

[1 of 1 positions shown; findings below may reference images not displayed]

FINDINGS: Mild left basilar atelectasis or scar is unchanged. Lungs otherwise
clear. Heart size normal. No pneumothorax or pleural effusion. No
acute bony abnormality. Severe bilateral glenohumeral osteoarthritis
noted.
IMPRESSION: Negative for aspiration.  No acute disease.

## 2020-10-25 IMAGING — MR MR HEAD W/O CM
11 of 12 series · 43 of 48 positions shown · non-contrast
Comparison: CT HEAD February 06, 2018 and MRI head April 18, 2017.

CLINICAL DATA: Slurred speech, LEFT facial droop beginning last
night. History of dementia, hypertension and hyperlipidemia.

EXAM:
MRI HEAD WITHOUT CONTRAST
TECHNIQUE: Multiplanar, multiecho pulse sequences of the brain and surrounding
structures were obtained without intravenous contrast.

[Series 5: DWI · axial · 3.0mm · 0.88mm/px · z∈[-85,+54]mm · 10 of 100 slices shown (1 of 4)]
[im 1/100]
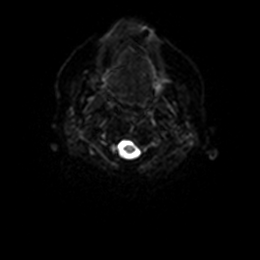
[im 12/100]
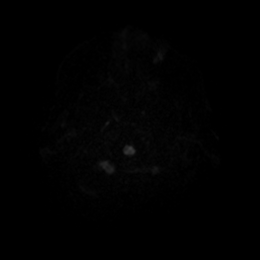
[im 23/100]
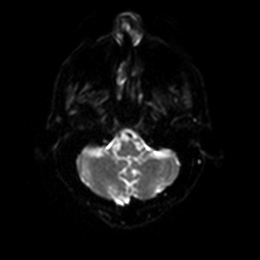
[im 34/100]
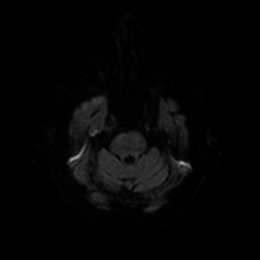
[im 45/100]
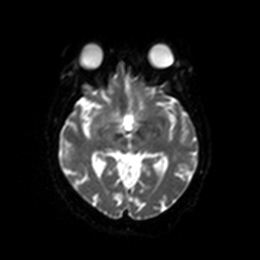
[im 56/100]
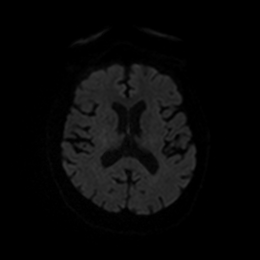
[im 67/100]
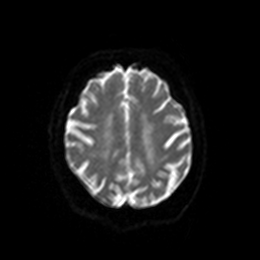
[im 78/100]
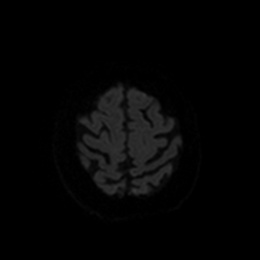
[im 89/100]
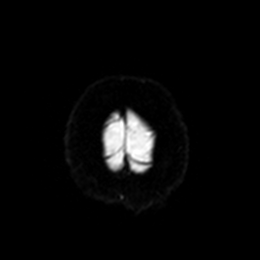
[im 100/100]
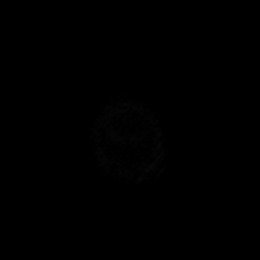

[Series 6: DWI · axial · 3.0mm · 0.88mm/px · z∈[-85,+54]mm · 4 of 50 slices shown (2 of 4)]
[im 1/50]
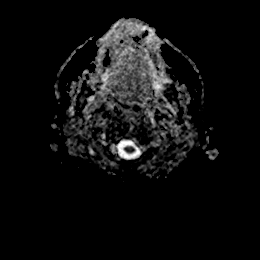
[im 17/50]
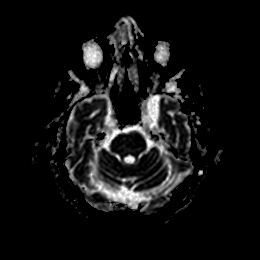
[im 33/50]
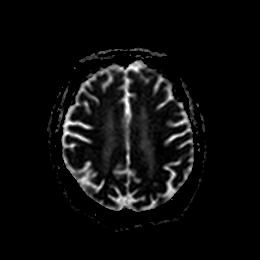
[im 50/50]
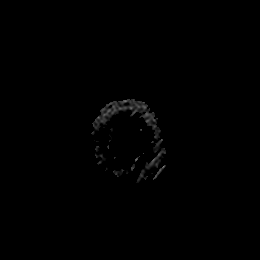

[Series 7: DWI · coronal · 4.0mm · 0.88mm/px · 6 of 64 slices shown (3 of 4)]
[im 1/64]
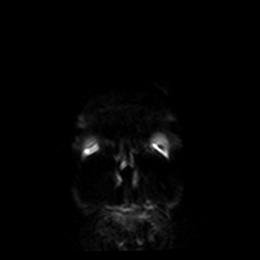
[im 13/64]
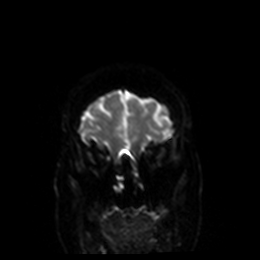
[im 26/64]
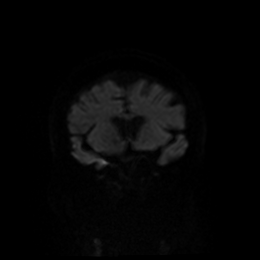
[im 38/64]
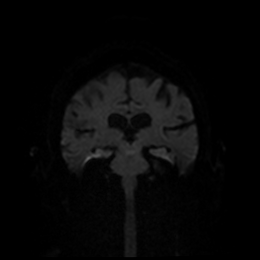
[im 51/64]
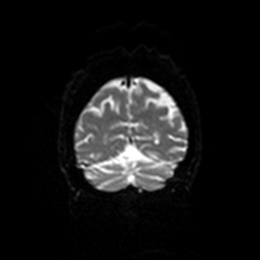
[im 64/64]
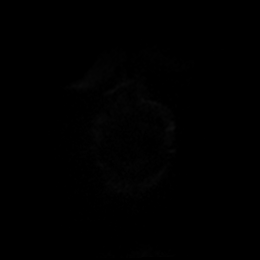

[Series 8: DWI · coronal · 4.0mm · 0.88mm/px · 3 of 32 slices shown (4 of 4)]
[im 1/32]
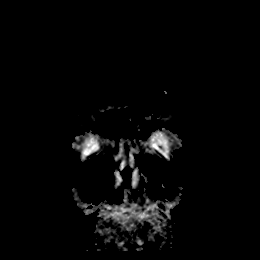
[im 16/32]
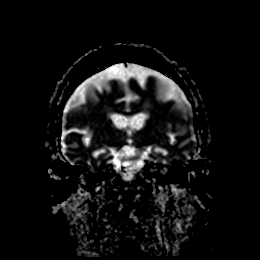
[im 32/32]
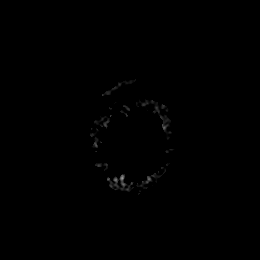

[Series 9: T1 · sagittal · 5.0mm · 0.75mm/px · 2 of 23 slices shown]
[im 1/23]
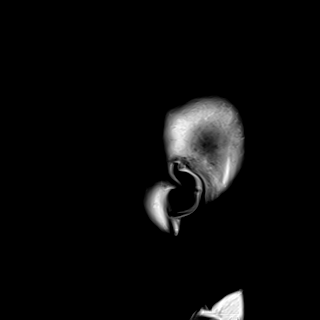
[im 23/23]
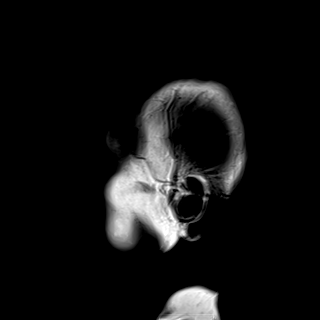

[Series 10: T2 · axial · 5.0mm · 0.72mm/px · z∈[-74,+66]mm · 2 of 25 slices shown]
[im 1/25]
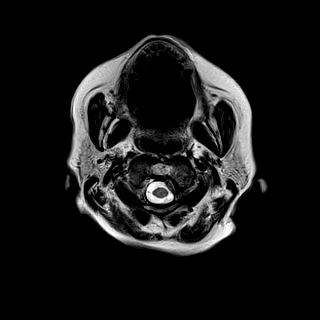
[im 25/25]
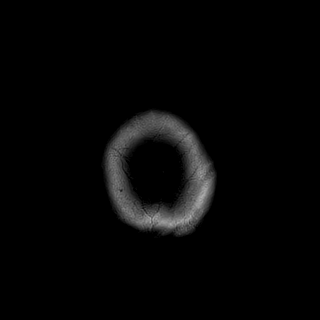

[Series 11: FLAIR · axial · 5.0mm · 0.45mm/px · z∈[-77,+62]mm · 2 of 25 slices shown]
[im 1/25]
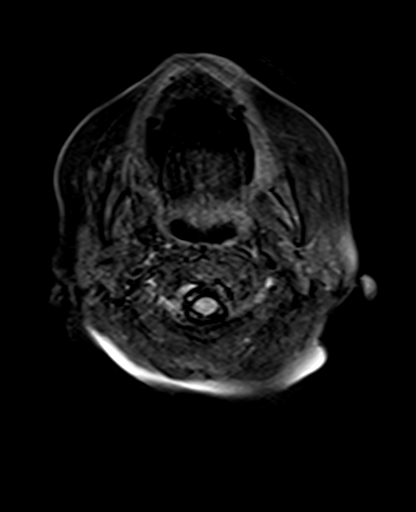
[im 25/25]
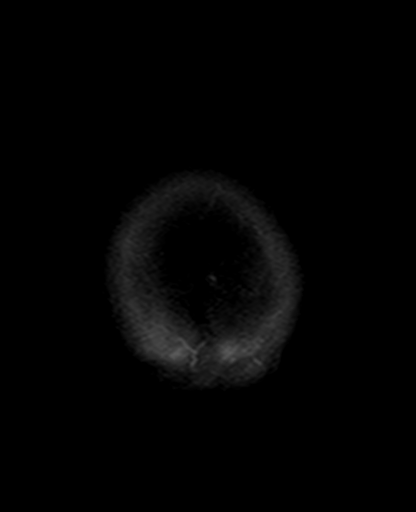

[Series 13: pha_images · axial · 3.0mm · 0.90mm/px · z∈[-94,+64]mm · 5 of 57 slices shown]
[im 1/57]
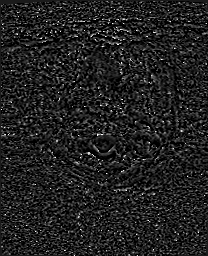
[im 15/57]
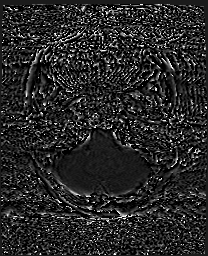
[im 29/57]
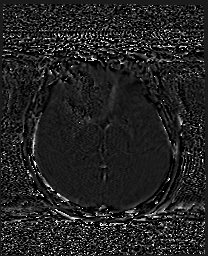
[im 43/57]
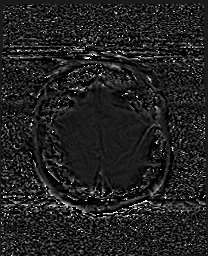
[im 57/57]
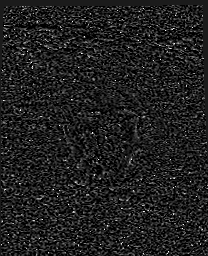

[Series 14: swi_images · axial · 3.0mm · 0.90mm/px · z∈[-97,+70]mm · 5 of 60 slices shown]
[im 1/60]
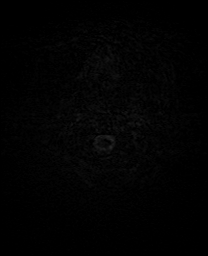
[im 15/60]
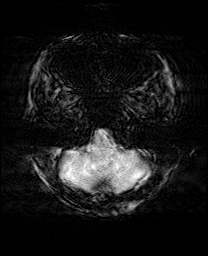
[im 30/60]
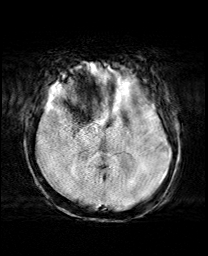
[im 45/60]
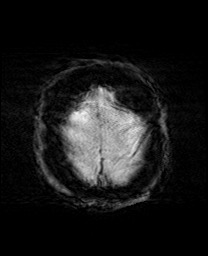
[im 60/60]
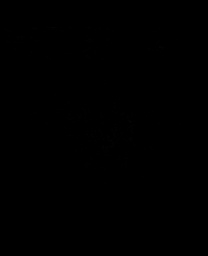

[Series 17: T2 post-contrast · coronal · 5.0mm · 0.72mm/px · 2 of 28 slices shown]
[im 1/28]
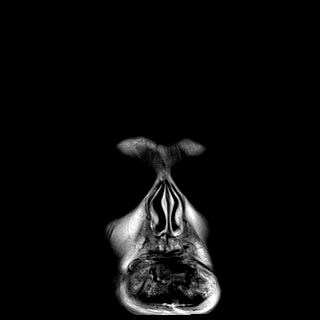
[im 28/28]
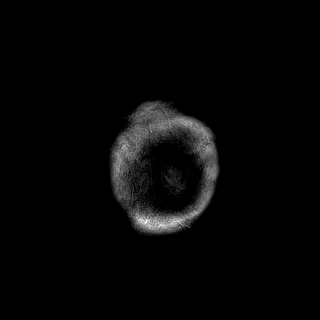

[Series 18: ax hemo · axial · 5.0mm · 0.86mm/px · z∈[-87,+49]mm · 2 of 25 slices shown]
[im 1/25]
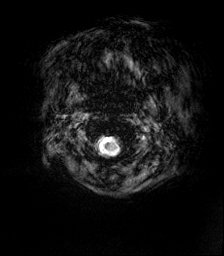
[im 25/25]
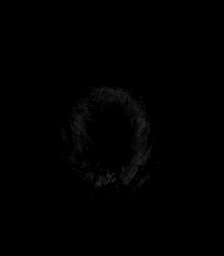

[43 of 48 positions shown; findings below may reference images not displayed]

FINDINGS: Moderately motion degraded sequences after multiple attempts.

INTRACRANIAL CONTENTS: No reduced diffusion to suggest acute
ischemia. No susceptibility artifact to suggest hemorrhage. RIGHT
cerebellar suspected developmental venous anomaly. Patchy
supratentorial white matter T2 hyperintensities compatible. No
advanced parenchymal brain volume loss for age. No hydrocephalus. No
suspicious parenchymal signal, masses, mass effect. No abnormal
extra-axial fluid collections. No extra-axial masses.

VASCULAR: Normal major intracranial vascular flow voids present at
skull base.

SKULL AND UPPER CERVICAL SPINE: No abnormal sellar expansion. No
suspicious calvarial bone marrow signal. Craniocervical junction
maintained.

SINUSES/ORBITS: The mastoid air-cells and included paranasal sinuses
are well-aerated.The included ocular globes and orbital contents are
non-suspicious.

OTHER: Patient is edentulous.
IMPRESSION: 1. Moderately motion degraded examination. No acute intracranial
process.
2. Moderate chronic small vessel ischemic changes.
